# Patient Record
Sex: Female | Born: 1990 | Race: White | Hispanic: No | Marital: Single | Smoking: Former smoker
Health system: Southern US, Community
[De-identification: ages and names within clinical notes are randomized; demographics above are authoritative.]

## PROBLEM LIST (undated history)

## (undated) DIAGNOSIS — G43909 Migraine, unspecified, not intractable, without status migrainosus: Secondary | ICD-10-CM

## (undated) DIAGNOSIS — J45909 Unspecified asthma, uncomplicated: Secondary | ICD-10-CM

## (undated) DIAGNOSIS — O139 Gestational [pregnancy-induced] hypertension without significant proteinuria, unspecified trimester: Secondary | ICD-10-CM

## (undated) DIAGNOSIS — F32A Depression, unspecified: Secondary | ICD-10-CM

## (undated) DIAGNOSIS — F329 Major depressive disorder, single episode, unspecified: Secondary | ICD-10-CM

## (undated) HISTORY — PX: TONSILLECTOMY: SUR1361

---
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
## 2008-01-05 ENCOUNTER — Inpatient Hospital Stay (HOSPITAL_COMMUNITY): Admission: AD | Admit: 2008-01-05 | Discharge: 2008-01-05 | Payer: Self-pay | Admitting: Obstetrics

## 2008-02-11 ENCOUNTER — Inpatient Hospital Stay (HOSPITAL_COMMUNITY): Admission: AD | Admit: 2008-02-11 | Discharge: 2008-02-11 | Payer: Self-pay | Admitting: Obstetrics

## 2008-02-12 ENCOUNTER — Inpatient Hospital Stay (HOSPITAL_COMMUNITY): Admission: AD | Admit: 2008-02-12 | Discharge: 2008-02-15 | Payer: Self-pay | Admitting: Obstetrics

## 2008-03-13 ENCOUNTER — Inpatient Hospital Stay (HOSPITAL_COMMUNITY): Admission: AD | Admit: 2008-03-13 | Discharge: 2008-03-13 | Payer: Self-pay | Admitting: Obstetrics

## 2008-03-15 ENCOUNTER — Inpatient Hospital Stay (HOSPITAL_COMMUNITY): Admission: AD | Admit: 2008-03-15 | Discharge: 2008-03-18 | Payer: Self-pay | Admitting: Obstetrics

## 2008-06-30 ENCOUNTER — Emergency Department (HOSPITAL_COMMUNITY): Admission: EM | Admit: 2008-06-30 | Discharge: 2008-06-30 | Payer: Self-pay | Admitting: Emergency Medicine

## 2008-11-13 ENCOUNTER — Ambulatory Visit (HOSPITAL_COMMUNITY): Admission: RE | Admit: 2008-11-13 | Discharge: 2008-11-13 | Payer: Self-pay | Admitting: Obstetrics

## 2008-12-31 ENCOUNTER — Emergency Department (HOSPITAL_COMMUNITY): Admission: EM | Admit: 2008-12-31 | Discharge: 2008-12-31 | Payer: Self-pay | Admitting: Emergency Medicine

## 2009-06-11 ENCOUNTER — Emergency Department (HOSPITAL_COMMUNITY): Admission: EM | Admit: 2009-06-11 | Discharge: 2009-06-11 | Payer: Self-pay | Admitting: Emergency Medicine

## 2009-11-07 ENCOUNTER — Emergency Department (HOSPITAL_COMMUNITY): Admission: EM | Admit: 2009-11-07 | Discharge: 2009-11-07 | Payer: Self-pay | Admitting: Emergency Medicine

## 2010-08-29 ENCOUNTER — Other Ambulatory Visit: Payer: Self-pay | Admitting: Obstetrics

## 2010-08-29 DIAGNOSIS — N6320 Unspecified lump in the left breast, unspecified quadrant: Secondary | ICD-10-CM

## 2010-09-02 ENCOUNTER — Ambulatory Visit
Admission: RE | Admit: 2010-09-02 | Discharge: 2010-09-02 | Disposition: A | Discharge status: Home / Self Care | Payer: Medicaid Other | Source: Ambulatory Visit | Attending: Obstetrics | Admitting: Obstetrics

## 2010-09-02 DIAGNOSIS — N6320 Unspecified lump in the left breast, unspecified quadrant: Secondary | ICD-10-CM

## 2010-10-01 NOTE — Discharge Summary (Signed)
Mackenzie Maxwell, Mackenzie Maxwell                 ACCOUNT NO.:  [id number]   MEDICAL RECORD NO.:  [id number]         PATIENT TYPE:  WINP   LOCATION:                                FACILITY:  [facility]   PHYSICIAN:  Kathreen Cosier, M.D.DATE OF BIRTH:  1991-03-26   DATE OF ADMISSION:  02/12/2008  DATE OF DISCHARGE:                               DISCHARGE SUMMARY   The patient is an 20 year old gravida 4, para 0-0-3-0.  [facility] March 21, 2008.  The patient was admitted because of severe right side pain and  back pain from her shoulder to her groin.  The patient states she was  lying down at night 2 days prior to admission when the pain began.  On  admission, she had a CT of her back, which showed a small adrenal  hemorrhage.  She had muscle tenderness on palpation from her rib cage to  her iliac crest.  She had a maternal fetal consult and that was  negative.   Hemoglobin was 10.1, platelets 255, white count 8.  Electrolytes normal.  Coagulation studies normal.  Sodium 135, potassium 3.6, chloride 107.  Her workup was totally negative and this pain was attributed to  musculoskeletal pain.   She was discharged home on Tylox and Ambien on February 15, 2008.  The  patient felt much better at that time.   DISCHARGE DIAGNOSIS:  Musculoskeletal strain.           ______________________________  Kathreen Cosier, M.D.     BAM/MEDQ  D:  03/15/2008  T:  03/15/2008  Job:  [id number]

## 2010-10-01 NOTE — Discharge Summary (Signed)
NAMEARICELA, BERTAGNOLLI                 ACCOUNT NO.:  [id number]   MEDICAL RECORD NO.:  [id number]          PATIENT TYPE:  MAT   LOCATION:  MATC                          FACILITY:  [facility]   PHYSICIAN:  Kathreen Cosier, M.D.DATE OF BIRTH:  13-Sep-1990   DATE OF ADMISSION:  03/13/2008  DATE OF DISCHARGE:  03/13/2008                               DISCHARGE SUMMARY   An 20 year old gravida 4, para 0-0-3-0.  [facility] March 21, 2008.   DICTATION ENDS AT THIS POINT           ______________________________  Kathreen Cosier, M.D.     BAM/MEDQ  D:  03/15/2008  T:  03/15/2008  Job:  [id number]

## 2010-10-01 NOTE — H&P (Signed)
NAMEKRISTENA, Mackenzie Maxwell                 ACCOUNT NO.:  [id number]   MEDICAL RECORD NO.:  [id number]          PATIENT TYPE:  INP   LOCATION:  9158                          FACILITY:  [facility]   PHYSICIAN:  Roseanna Rainbow, M.D.DATE OF BIRTH:  Sep 26, 1990   DATE OF ADMISSION:  02/12/2008  DATE OF DISCHARGE:                              HISTORY & PHYSICAL   CHIEF COMPLAINT:  The patient is an 20 year old para 0 with an estimated  date of confinement of November 3, with an intrauterine pregnancy at 34  weeks complaining of right-sided back pain, epigastric pain, and right  lower quadrant pain.   HISTORY OF PRESENT ILLNESS:  The patient had presented to maternity  admissions the day before with diffused colicky epigastric pain.  She  returns this evening with lateralizing the pain to the right and  associated nausea and vomiting.  She had a normal bowel movement earlier  today.  She also describes a decreased appetite.  She denies any fever.  She also denies any recent trauma, urinary tract infection symptoms,  hematuria.   SOCIAL HISTORY:  She is single.  She denies any tobacco, ethanol, or  drug use.   PAST GYNECOLOGIC HISTORY:  Normal triad.  There is a history of  chlamydia.   PAST OBSTETRICAL HISTORY:  There is a history of 3 voluntary termination  of pregnancies.   PAST MEDICAL HISTORY:  Asthma.   PAST SURGICAL HISTORY:  Please see the above T and A.  Antepartum course  onset of care at 10 weeks.   OBSTETRICAL RISK FACTORS:  Adolescent.   FAMILY HISTORY:  Noncontributory.   PHYSICAL EXAMINATION:  VITAL SIGNS:  Stable, afebrile.  Fetal heart  tracing reassuring.  Tocodynamometer regular uterine contractions.  GENERAL:  Well-developed female in acute distress.  ABDOMEN:  No rebound or guarding.  Difficult secondary to the patient's  discomfort.  GENITOURINARY:  Sterile vaginal exam per the RN.  The cervix is closed.   LABORATORY AND WORKUP:  Abdominal ultrasound today normal.   White blood  cell count 12.7.  Today, it was 9.3 one day prior, hemoglobin 11.7,  hematocrit 34.3, platelets 289,000, lipase, amylase normal, complete  metabolic profile normal.  On September 25, urine micro negative.  Urinalysis, small leukocytes, DIC panel, fibrinogen 460, D-dimer is  1.22.   ASSESSMENT:  Intrauterine pregnancy at 34 plus weeks with acute  abdominal pain of questionable etiology.  Differential diagnosis, rule  out appendicitis, cold abruption.  Fetal heart tracing consistent with  fetal well being.   PLAN:  Admission.  We will repeat the DIC panel and obtain a CT scan of  the abdomen.  Supportive care.      Roseanna Rainbow, M.D.  Electronically Signed     LAJ/MEDQ  D:  02/12/2008  T:  02/13/2008  Job:  [id number]

## 2010-10-04 NOTE — Op Note (Signed)
[place]  Patient:    JAKAI, ONOFRE                          MRN: [id number] Proc. Date: 01/27/00 Adm. Date:  [id number] Attending:  Carlena Sax CC:         Teena Irani. Donnie Coffin, M.D.   Operative Report  PREOPERATIVE DIAGNOSIS: 1. Obstructive sleep apnea. 2. Tonsil and adenoid hypertrophy. 3. Nasal airway obstruction.  POSTOPERATIVE DIAGNOSES: 1. Obstructive sleep apnea. 2. Tonsil and adenoid hypertrophy. 3. Nasal airway obstruction.  OPERATION:  Tonsillectomy and adenoidectomy.  SURGEON:  Veverly Fells. Arletha Grippe, M.D.  ANESTHESIA:  General endotracheal.  INDICATION FOR PROCEDURE:  This is a 20 year old female referred per the courtesy of Dr. Maryellen Pile.  Does have a history of nasal airway obstruction, chronic mouth breathing, loud snoring at night and breath holding episodes consistent with obstructive sleep apnea. Physical examination does show extremely enlarged tonsils bilaterally and large adenoidal tissue in the nasopharynx.  Based on her history and physical examination, I have recommended proceeding with the above noted surgical procedure.  I discussed extensively with her and the family the risks and benefits of surgery, including the risk of general anesthesia, bleeding and normal recovery period after this type of surgery.  I have entertained questions and answered them appropriately.  Informed consent has been obtained.  The patient presents for the above noted procedure.  OPERATIVE FINDINGS:  Bilaterally enlarged tonsils.  Large adenoidal tissue in the nasopharynx.  DESCRIPTION OF PROCEDURE:  The patient was brought to the operating room and placed in the supine position.  General endotracheal anesthesia was administered via the anesthesiologist without complication.  The patient was administered 500 mg of Ancef IV times one and 6 mg of Decadron IV times one. The head of the table was turned 90 degrees.  The patients face was  draped in the standard fashion. A Crowe-Davis mouth retractor was inserted into the oral cavity and this was used to retract the mouth open.  First attention was turned to the right tonsil. Crowe-Dallas clamp was used to grasp the tonsil and retract it medially.  Harmonic scalpel was then used to dissect it free from the tonsillar fossa.  Once the tonsil was then transected, it was removed through the oral cavity and sent to the surgery pathology for permanent section analysis.  Bleeding was controlled with combination of harmonic scalpel and suction cautery without difficulty.  Next, attention was turned to the left tonsil and an identical procedure was carried out on this side. Compared to the right with identical results. After this done, a red rubber catheter was placed through the nares and brought out through the oral cavity.  This was used to retract the soft palate.  Indirect mirror examination of the nasopharynx showed enlarged adenoidal tissue obstructing the choana bilaterally.  An adenoidectomy was then performed with a few sweeps of the adenoid curet and bleeding was controlled with suction cautery without difficulty.  Nose and mouth were then irrigated with a copious amount of irrigation fluid and suctioned dry.  There was no evidence of any active bleeding.  The red rubber catheter was released and brought out through the nasal chamber under suction without difficulty.  Oral gastric tube was placed. This was used to decompress the stomach contents and then removed without incident.  Next a total of 3 cc of 0.5% Marcaine solution with 1:200,000 epinephrine was infiltrated into the anterior tonsillar pillar  and tongue base bilaterally. Crowe-Davis mouth retractors were released and brought out through the oral cavity without incident.  FLUIDS GIVEN DURING PROCEDURE:  Approximately 300 cc of Crystalloid.  ESTIMATED BLOOD LOSS:  Less than 50 cc.  DRAINS:  None.  PACKS:   None.  SPECIMENS SENT:  Tonsils times two and adenoids.  DISPOSITION:  The patient tolerated the procedure well without complications and was extubated in the operating room and transferred to recovery room in stable condition.  COUNTS:  Sponge, needle and instrument counts were correct at the end of the procedure.  TOTAL DURATION OF PROCEDURE:  Approximately one hour.  PLAN:  The patient will be admitted for overnight recovery and once she is recovered well, she will be discharged on January 28, 2000.  She will be sent home on Amoxicillin elixir 250 mg p.o. t.i.d. x10 days and Tylenol with codeine elixir 250 cc with two refills, 5 to 10 cc p.o. q.4h. p.r.n. pain. She is to have light activity, post tonsillectomy diet for two weeks after surgery.  Both her and her family were given oral and written instructions. They are to call with any problems with bleeding, fever, vomiting or reaction to medications, further questions.  She will follow up in the office for postoperative check on Tuesday, February 11, 2000 at 4:00 p.m. DD:  01/27/00 TD:  01/27/00 Job: [id number]

## 2011-02-10 LAB — POCT PREGNANCY, URINE: Operator id: 120561

## 2011-02-10 LAB — URINALYSIS, ROUTINE W REFLEX MICROSCOPIC
Bilirubin Urine: NEGATIVE
Glucose, UA: NEGATIVE
Glucose, UA: NEGATIVE
Hgb urine dipstick: NEGATIVE
Ketones, ur: NEGATIVE
Nitrite: NEGATIVE
Protein, ur: NEGATIVE
Specific Gravity, Urine: >1.03 — ABNORMAL HIGH
Specific Gravity, Urine: 1.02
pH: 6
pH: 7

## 2011-02-10 LAB — CBC
HCT: 33.4 — ABNORMAL LOW
Hemoglobin: 11.6 — ABNORMAL LOW
MCV: 80.3
Platelets: 313
RDW: 16.4 — ABNORMAL HIGH

## 2011-02-10 LAB — GC/CHLAMYDIA PROBE AMP, GENITAL

## 2011-02-10 LAB — WET PREP, GENITAL: Yeast Wet Prep HPF POC: NONE SEEN

## 2011-02-11 LAB — URINALYSIS, ROUTINE W REFLEX MICROSCOPIC
Bilirubin Urine: NEGATIVE
Glucose, UA: NEGATIVE
Hgb urine dipstick: NEGATIVE
Ketones, ur: >80 — AB
Ketones, ur: NEGATIVE
Nitrite: NEGATIVE
Nitrite: NEGATIVE
Protein, ur: 30 — AB
Protein, ur: NEGATIVE
Specific Gravity, Urine: 1.015
Urobilinogen, UA: 0.2
pH: 7

## 2011-02-11 LAB — URINE MICROSCOPIC-ADD ON

## 2011-02-13 LAB — WET PREP, GENITAL
Clue Cells Wet Prep HPF POC: NONE SEEN
Trich, Wet Prep: NONE SEEN

## 2011-02-13 LAB — URINALYSIS, ROUTINE W REFLEX MICROSCOPIC
Nitrite: NEGATIVE
Specific Gravity, Urine: 1.015
pH: 7

## 2011-02-17 LAB — CBC
HCT: 32.3 — ABNORMAL LOW
Hemoglobin: 10.5 — ABNORMAL LOW
Hemoglobin: 10.8 — ABNORMAL LOW
Hemoglobin: 10.8 — ABNORMAL LOW
MCHC: 34.1
MCHC: 34.1
MCV: 80.9
MCV: 81
MCV: 80.4
Platelets: 255
Platelets: 289
Platelets: 243
RBC: 3.8 — ABNORMAL LOW
RBC: 3.94
RBC: 4.31
RBC: 3.66 — ABNORMAL LOW
RDW: 15.8 — ABNORMAL HIGH
RDW: 15.8 — ABNORMAL HIGH
RDW: 17 — ABNORMAL HIGH
WBC: 10.1
WBC: 12.7 — ABNORMAL HIGH
WBC: 10.3

## 2011-02-17 LAB — DIC (DISSEMINATED INTRAVASCULAR COAGULATION)PANEL
D-Dimer, Quant: 1.26 — ABNORMAL HIGH
Fibrinogen: 464
Platelets: 257
Prothrombin Time: 13.2
Smear Review: NONE SEEN
aPTT: 27

## 2011-02-17 LAB — DIFFERENTIAL
Basophils Absolute: 0
Basophils Relative: 0
Basophils Relative: 1
Eosinophils Relative: 0
Eosinophils Relative: 1
Lymphocytes Relative: 11 — ABNORMAL LOW
Lymphs Abs: 1.4
Monocytes Absolute: 1.1 — ABNORMAL HIGH
Monocytes Relative: 13 — ABNORMAL HIGH
Neutro Abs: 5.4

## 2011-02-17 LAB — COMPREHENSIVE METABOLIC PANEL
ALT: 8
ALT: 9
AST: 13
AST: 15
Albumin: 2.4 — ABNORMAL LOW
Albumin: 3.2 — ABNORMAL LOW
Alkaline Phosphatase: 66
BUN: 4 — ABNORMAL LOW
CO2: 22
CO2: 22
Calcium: 8.1 — ABNORMAL LOW
Calcium: 8.5
Chloride: 107
Chloride: 108
Creatinine, Ser: 0.46
Creatinine, Ser: 0.47
Creatinine, Ser: 0.48
GFR calc Af Amer: >60
GFR calc Af Amer: >60
GFR calc non Af Amer: >60
GFR calc non Af Amer: >60
GFR calc non Af Amer: >60
Glucose, Bld: 92
Glucose, Bld: 98
Potassium: 4
Sodium: 133 — ABNORMAL LOW
Sodium: 135
Total Bilirubin: 0.5
Total Bilirubin: 0.8
Total Protein: 5.2 — ABNORMAL LOW

## 2011-02-17 LAB — URINALYSIS, ROUTINE W REFLEX MICROSCOPIC
Glucose, UA: NEGATIVE
Hgb urine dipstick: NEGATIVE
Hgb urine dipstick: NEGATIVE
Ketones, ur: NEGATIVE
Protein, ur: NEGATIVE
Specific Gravity, Urine: 1.01
Urobilinogen, UA: 0.2
Urobilinogen, UA: 0.2

## 2011-02-17 LAB — CCBB MATERNAL DONOR DRAW

## 2011-02-17 LAB — LIPASE, BLOOD
Lipase: 14
Lipase: 14

## 2011-02-17 LAB — ABO/RH: ABO/RH(D): O POS

## 2011-02-17 LAB — URINE MICROSCOPIC-ADD ON

## 2011-02-17 LAB — AMYLASE: Amylase: 98

## 2011-02-17 LAB — CORTISOL-AM, BLOOD: Cortisol - AM: 28 — ABNORMAL HIGH

## 2011-02-17 LAB — RPR: RPR Ser Ql: NONREACTIVE

## 2011-03-06 LAB — PREGNANCY, URINE: Preg Test, Ur: NEGATIVE

## 2011-04-26 ENCOUNTER — Emergency Department (HOSPITAL_COMMUNITY)
Admission: EM | Admit: 2011-04-26 | Discharge: 2011-04-26 | Disposition: A | Discharge status: Home / Self Care | Payer: Medicaid Other | Source: Home / Self Care | Attending: Emergency Medicine | Admitting: Emergency Medicine

## 2011-04-26 DIAGNOSIS — L259 Unspecified contact dermatitis, unspecified cause: Secondary | ICD-10-CM

## 2011-04-26 MED ORDER — PREDNISONE 10 MG PO TABS: 20.0000 mg | ORAL_TABLET | Freq: Every day | ORAL | Status: AC

## 2011-04-26 NOTE — ED Provider Notes (Signed)
History     CSN: [id number] Arrival date & time: 04/26/2011  2:28 PM   None     Chief Complaint  Patient presents with  . Rash    (Consider location/radiation/quality/duration/timing/severity/associated sxs/prior treatment) HPI Comments: Pt works in Customer service [employer].  Noticed 12/5 after work B medial ankles were itchy, red spots; now also has a patch on L forearm; thinks may have come in contact with something at work.   Patient is a 20 y.o. female presenting with rash. The history is provided by the patient.  Rash  The current episode started more than 2 days ago. The problem has been gradually worsening. The problem is associated with chemical exposure. There has been no fever. The rash is present on the right ankle, left ankle and left arm. The pain is at a severity of 0/10. The patient is experiencing no pain. Associated symptoms include itching. She has tried antihistamines for the symptoms. The treatment provided no relief.    History reviewed. No pertinent past medical history.  History reviewed. No pertinent past surgical history.  History reviewed. No pertinent family history.  History  Substance Use Topics  . Smoking status: Not on file  . Smokeless tobacco: Not on file  . Alcohol Use: Not on file    OB History    Grav Para Term Preterm Abortions TAB SAB Ect Mult Living                  Review of Systems  Constitutional: Negative for fever and chills.  Respiratory: Negative for shortness of breath and wheezing.   Skin: Positive for itching and rash.    Allergies  Ibuprofen  Home Medications   Current Outpatient Rx  Name Route Sig Dispense Refill  . DIPHENHYDRAMINE HCL 25 MG PO CAPS Oral Take 25 mg by mouth every 6 (six) hours as needed.      Marland Kitchen PREDNISONE 10 MG PO TABS Oral Take 2 tablets (20 mg total) by mouth daily. 10 tablet 0    BP 129/75  Pulse 56  Temp(Src) 98.2 F (36.8 C) (Oral)  Resp 16  SpO2 100%  LMP 04/17/2011  Physical Exam    Constitutional: She appears well-developed and well-nourished. No distress.  Pulmonary/Chest: Effort normal.  Skin: Skin is warm and dry. Rash noted. Rash is papular.       ED Course  Procedures (including critical care time)  Labs Reviewed - No data to display No results found.   1. Contact dermatitis       MDM          Cathlyn Parsons, NP 04/26/11 1615

## 2011-04-26 NOTE — ED Notes (Signed)
Patient complains of rash to both ankles, left forearm and right hand.  Patient states rash started on 04/23/11.  Patient states rash itches, hurts and burns.  Patient denies anyone at home having the same rash.  Patient has tried OTC benadryl without relief

## 2011-04-30 NOTE — ED Provider Notes (Signed)
Medical screening examination/treatment/procedure(s) were performed by non-physician practitioner and as supervising physician I was immediately available for consultation/collaboration.   Barkley Bruns MD.    Barkley Bruns, MD 04/30/11 [phone]

## 2011-10-02 DIAGNOSIS — J45909 Unspecified asthma, uncomplicated: Secondary | ICD-10-CM | POA: Insufficient documentation

## 2012-06-22 ENCOUNTER — Ambulatory Visit: Payer: 59

## 2013-09-12 ENCOUNTER — Other Ambulatory Visit: Payer: Self-pay | Admitting: Obstetrics

## 2014-11-18 ENCOUNTER — Encounter (HOSPITAL_COMMUNITY): Payer: Self-pay | Admitting: Emergency Medicine

## 2014-11-18 ENCOUNTER — Emergency Department (HOSPITAL_COMMUNITY): Payer: 59

## 2014-11-18 ENCOUNTER — Emergency Department (HOSPITAL_COMMUNITY)
Admission: EM | Admit: 2014-11-18 | Discharge: 2014-11-18 | Disposition: A | Discharge status: Home / Self Care | Payer: 59 | Attending: Emergency Medicine | Admitting: Emergency Medicine

## 2014-11-18 DIAGNOSIS — H538 Other visual disturbances: Secondary | ICD-10-CM | POA: Insufficient documentation

## 2014-11-18 DIAGNOSIS — R51 Headache: Secondary | ICD-10-CM | POA: Insufficient documentation

## 2014-11-18 DIAGNOSIS — Z720 Tobacco use: Secondary | ICD-10-CM | POA: Diagnosis not present

## 2014-11-18 DIAGNOSIS — R519 Headache, unspecified: Secondary | ICD-10-CM

## 2014-11-18 DIAGNOSIS — R112 Nausea with vomiting, unspecified: Secondary | ICD-10-CM | POA: Diagnosis not present

## 2014-11-18 DIAGNOSIS — J45909 Unspecified asthma, uncomplicated: Secondary | ICD-10-CM | POA: Diagnosis not present

## 2014-11-18 HISTORY — DX: Unspecified asthma, uncomplicated: J45.909

## 2014-11-18 MED ORDER — DIPHENHYDRAMINE HCL 50 MG/ML IJ SOLN
25.0000 mg | Freq: Once | INTRAMUSCULAR | Status: AC
  Administered 2014-11-18: 25 mg via INTRAVENOUS
  Filled 2014-11-18: qty 1

## 2014-11-18 MED ORDER — PROCHLORPERAZINE EDISYLATE 5 MG/ML IJ SOLN
10.0000 mg | Freq: Once | INTRAMUSCULAR | Status: AC
  Administered 2014-11-18: 10 mg via INTRAVENOUS
  Filled 2014-11-18: qty 2

## 2014-11-18 MED ORDER — ONDANSETRON 8 MG PO TBDP: ORAL_TABLET | ORAL | Status: DC

## 2014-11-18 MED ORDER — SODIUM CHLORIDE 0.9 % IV BOLUS (SEPSIS)
1000.0000 mL | Freq: Once | INTRAVENOUS | Status: AC
  Administered 2014-11-18: 1000 mL via INTRAVENOUS

## 2014-11-18 NOTE — ED Notes (Signed)
Pt states she has headaches, but this is a migraine and is the worst she has ever experienced.  Her eyes are sensitive to the light.  Nausea is present at this time.  Has had emesis x2 today.  Negative for fever or diarrhea.  She cannot relate this to any event that would have prompted the migraine.

## 2014-11-18 NOTE — ED Provider Notes (Signed)
CSN: [id number]     Arrival date & time 11/18/14  1429 History   First MD Initiated Contact with Patient 11/18/14 1503     Chief Complaint  Patient presents with  . Migraine  . Emesis     (Consider location/radiation/quality/duration/timing/severity/associated sxs/prior Treatment) The history is provided by the patient and medical records. No language interpreter was used.    Mackenzie Maxwell is a 24 y.o. female  with a hx of asthma presents to the Emergency Department complaining of sudden, persistent, progressively worsening left sided headache onset 2 hours prior to arrival. Pt denies thunderclap headache, but endorses worst headache of her life.  She rates the pain at a 10/10, describes it as sharp and denies radiation of the pain.  She reports intermittent headaches in the past but these are generalized and resolve with tylenol.  Associated symptoms include blurred vision, nausea and vomiting without abdominal pain.  Nothing makes it better and light and sound makes it worse.  Pt denies fever, chills, neck pain, chest pain, SOB, abd pain, diarrhea, focal weakness, numbness, tingling, dysuria, hematuria, syncope.  She denies IVDU, cocaine, cancer or blood thinners.  LMP: 11-11-14.     Past Medical History  Diagnosis Date  . Asthma    Past Surgical History  Procedure Laterality Date  . Tonsillectomy     No family history on file. History  Substance Use Topics  . Smoking status: Current Every Day Smoker -- 1.00 packs/day    Types: Cigarettes  . Smokeless tobacco: Not on file  . Alcohol Use: Yes     Comment: social    OB History    No data available     Review of Systems  Constitutional: Negative for fever, diaphoresis, appetite change, fatigue and unexpected weight change.  HENT: Negative for mouth sores.   Eyes: Positive for photophobia and visual disturbance.  Respiratory: Negative for cough, chest tightness, shortness of breath and wheezing.   Cardiovascular: Negative for  chest pain.  Gastrointestinal: Positive for nausea and vomiting. Negative for abdominal pain, diarrhea and constipation.  Endocrine: Negative for polydipsia, polyphagia and polyuria.  Genitourinary: Negative for dysuria, urgency, frequency and hematuria.  Musculoskeletal: Negative for back pain and neck stiffness.  Skin: Negative for rash.  Allergic/Immunologic: Negative for immunocompromised state.  Neurological: Positive for headaches. Negative for syncope and light-headedness.  Hematological: Does not bruise/bleed easily.  Psychiatric/Behavioral: Negative for sleep disturbance. The patient is not nervous/anxious.       Allergies  Ibuprofen  Home Medications   Prior to Admission medications   Medication Sig Start Date End Date Taking? Authorizing Provider  ondansetron (ZOFRAN ODT) 8 MG disintegrating tablet 8mg  ODT q4 hours prn nausea 11/18/14   Julianna Vanwagner, PA-C   BP 132/81 mmHg  Pulse 60  Temp(Src) 98 F (36.7 C) (Oral)  Resp 20  SpO2 100%  LMP 11/11/2014 (Exact Date) Physical Exam  Constitutional: She is oriented to person, place, and time. She appears well-developed and well-nourished. No distress.  Awake, alert, nontoxic appearance  HENT:  Head: Normocephalic and atraumatic.  Mouth/Throat: Oropharynx is clear and moist. No oropharyngeal exudate.  Eyes: Conjunctivae and EOM are normal. Pupils are equal, round, and reactive to light. No scleral icterus.  No horizontal, vertical or rotational nystagmus  Neck: Normal range of motion. Neck supple.  Full active and passive ROM without pain No midline or paraspinal tenderness No nuchal rigidity or meningeal signs  Cardiovascular: Normal rate, regular rhythm and intact distal pulses.   Pulmonary/Chest: Effort  normal and breath sounds normal. No respiratory distress. She has no wheezes. She has no rales.  Equal chest expansion  Abdominal: Soft. Bowel sounds are normal. She exhibits no mass. There is no tenderness.  There is no rebound and no guarding.  Musculoskeletal: Normal range of motion. She exhibits no edema.  Lymphadenopathy:    She has no cervical adenopathy.  Neurological: She is alert and oriented to person, place, and time. She has normal reflexes. No cranial nerve deficit. She exhibits normal muscle tone. Coordination normal.  Mental Status:  Alert, oriented, thought content appropriate. Speech fluent without evidence of aphasia. Able to follow 2 step commands without difficulty.  Cranial Nerves:  II:  Peripheral visual fields grossly normal, pupils equal, round, reactive to light III,IV, VI: ptosis not present, extra-ocular motions intact bilaterally  V,VII: smile symmetric, facial light touch sensation equal VIII: hearing grossly normal bilaterally  IX,X: gag reflex present  XI: bilateral shoulder shrug equal and strong XII: midline tongue extension  Motor:  5/5 in upper and lower extremities bilaterally including strong and equal grip strength and dorsiflexion/plantar flexion Sensory: Pinprick and light touch normal in all extremities.  Deep Tendon Reflexes: 2+ and symmetric  Cerebellar: normal finger-to-nose with bilateral upper extremities Gait: normal gait and balance CV: distal pulses palpable throughout   Skin: Skin is warm and dry. No rash noted. She is not diaphoretic.  Psychiatric: She has a normal mood and affect. Her behavior is normal. Judgment and thought content normal.  Nursing note and vitals reviewed.   ED Course  Procedures (including critical care time) Labs Review Labs Reviewed - No data to display  Imaging Review Ct Head Wo Contrast  11/18/2014   CLINICAL DATA:  Headaches, migraines, worst ever experienced, photophobia, nausea, vomiting, history asthma and smoking  EXAM: CT HEAD WITHOUT CONTRAST  TECHNIQUE: Contiguous axial images were obtained from the base of the skull through the vertex without intravenous contrast.  COMPARISON:  11/07/2009  FINDINGS:  Normal ventricular morphology.  No midline shift or mass effect.  Normal appearance of brain parenchyma.  No intracranial hemorrhage, mass lesion, or acute infarction.  Visualized paranasal sinuses and mastoid air cells clear.  Bones unremarkable.  IMPRESSION: Normal exam.   Electronically Signed   By: Lavonia Dana M.D.   On: 11/18/2014 16:17     EKG Interpretation None      MDM   Final diagnoses:  Nonintractable headache, unspecified chronicity pattern, unspecified headache type   Mackenzie Maxwell presents with sudden onset headache, different from any other in the past, onset 2 hours ago.  Pt without focal neurologic deficit on exam.  Will give abortive therapy and obtain CT scan.  4:38 PM CT without acute abnormality.  Specifically no evidence of SAH.  Pt headache has resolved and she remains without focal neurologic deficits.  Presentation is non concerning for Meningitis, or temporal arteritis. Pt is afebrile with no focal neuro deficits, nuchal rigidity.  Pt with complete resolution of visual changes after headache resolution. Pt is to follow up with PCP to discuss further medication. Pt verbalizes understanding and is agreeable with plan to dc.   BP 132/81 mmHg  Pulse 60  Temp(Src) 98 F (36.7 C) (Oral)  Resp 20  SpO2 100%  LMP 11/11/2014 (Exact Date)      Abigail Butts, PA-C 11/18/14 1640  Charlesetta Shanks, MD 11/19/14 2328

## 2014-11-18 NOTE — Discharge Instructions (Signed)
1. Medications: zofran for nausea, usual home medications 2. Treatment: rest, drink plenty of fluids,  3. Follow Up: Please followup with your primary doctor in 3 days for discussion of your diagnoses and further evaluation after today's visit; if you do not have a primary care doctor use the resource guide provided to find one; Please return to the ER for worsening headache, persistent vomiting, fevers   General Headache Without Cause A headache is pain or discomfort felt around the head or neck area. The specific cause of a headache may not be found. There are many causes and types of headaches. A few common ones are:  Tension headaches.  Migraine headaches.  Cluster headaches.  Chronic daily headaches. HOME CARE INSTRUCTIONS   Keep all follow-up appointments with your caregiver or any specialist referral.  Only take over-the-counter or prescription medicines for pain or discomfort as directed by your caregiver.  Lie down in a dark, quiet room when you have a headache.  Keep a headache journal to find out what may trigger your migraine headaches. For example, write down:  What you eat and drink.  How much sleep you get.  Any change to your diet or medicines.  Try massage or other relaxation techniques.  Put ice packs or heat on the head and neck. Use these 3 to 4 times per day for 15 to 20 minutes each time, or as needed.  Limit stress.  Sit up straight, and do not tense your muscles.  Quit smoking if you smoke.  Limit alcohol use.  Decrease the amount of caffeine you drink, or stop drinking caffeine.  Eat and sleep on a regular schedule.  Get 7 to 9 hours of sleep, or as recommended by your caregiver.  Keep lights dim if bright lights bother you and make your headaches worse. SEEK MEDICAL CARE IF:   You have problems with the medicines you were prescribed.  Your medicines are not working.  You have a change from the usual headache.  You have nausea or  vomiting. SEEK IMMEDIATE MEDICAL CARE IF:   Your headache becomes severe.  You have a fever.  You have a stiff neck.  You have loss of vision.  You have muscular weakness or loss of muscle control.  You start losing your balance or have trouble walking.  You feel faint or pass out.  You have severe symptoms that are different from your first symptoms. MAKE SURE YOU:   Understand these instructions.  Will watch your condition.  Will get help right away if you are not doing well or get worse. Document Released: 05/05/2005 Document Revised: 07/28/2011 Document Reviewed: 05/21/2011 [facility] Patient Information 2015 [place], [place]. This information is not intended to replace advice given to you by your health care provider. Make sure you discuss any questions you have with your health care provider.

## 2014-11-18 NOTE — ED Notes (Signed)
PA at bedside.

## 2014-11-18 NOTE — ED Notes (Signed)
Pt from home c/o migraine and emesis x2 hours. Pt has not taken any meds for pain. Pt is A&O and in NAD

## 2016-02-14 DIAGNOSIS — G43909 Migraine, unspecified, not intractable, without status migrainosus: Secondary | ICD-10-CM | POA: Insufficient documentation

## 2017-06-19 ENCOUNTER — Encounter (HOSPITAL_COMMUNITY): Payer: Self-pay

## 2017-06-19 ENCOUNTER — Other Ambulatory Visit (HOSPITAL_COMMUNITY): Payer: Self-pay | Admitting: Obstetrics & Gynecology

## 2017-06-19 ENCOUNTER — Ambulatory Visit (HOSPITAL_COMMUNITY)
Admission: RE | Admit: 2017-06-19 | Discharge: 2017-06-19 | Disposition: A | Discharge status: Home / Self Care | Payer: 59 | Source: Ambulatory Visit | Attending: Obstetrics & Gynecology | Admitting: Obstetrics & Gynecology

## 2017-06-19 DIAGNOSIS — R1031 Right lower quadrant pain: Secondary | ICD-10-CM

## 2017-06-19 DIAGNOSIS — D251 Intramural leiomyoma of uterus: Secondary | ICD-10-CM | POA: Insufficient documentation

## 2017-06-19 DIAGNOSIS — N939 Abnormal uterine and vaginal bleeding, unspecified: Secondary | ICD-10-CM

## 2017-06-19 DIAGNOSIS — R102 Pelvic and perineal pain: Secondary | ICD-10-CM | POA: Insufficient documentation

## 2017-06-19 DIAGNOSIS — O209 Hemorrhage in early pregnancy, unspecified: Secondary | ICD-10-CM | POA: Insufficient documentation

## 2017-06-19 DIAGNOSIS — O3411 Maternal care for benign tumor of corpus uteri, first trimester: Secondary | ICD-10-CM | POA: Diagnosis not present

## 2017-06-19 DIAGNOSIS — Z3A01 Less than 8 weeks gestation of pregnancy: Secondary | ICD-10-CM | POA: Diagnosis not present

## 2018-03-17 DIAGNOSIS — E279 Disorder of adrenal gland, unspecified: Secondary | ICD-10-CM | POA: Insufficient documentation

## 2018-03-22 DIAGNOSIS — D582 Other hemoglobinopathies: Secondary | ICD-10-CM | POA: Insufficient documentation

## 2018-04-02 DIAGNOSIS — Z283 Underimmunization status: Secondary | ICD-10-CM | POA: Insufficient documentation

## 2018-04-02 DIAGNOSIS — Z2839 Other underimmunization status: Secondary | ICD-10-CM | POA: Insufficient documentation

## 2018-04-16 DIAGNOSIS — F32A Depression, unspecified: Secondary | ICD-10-CM | POA: Insufficient documentation

## 2018-04-16 DIAGNOSIS — F329 Major depressive disorder, single episode, unspecified: Secondary | ICD-10-CM | POA: Insufficient documentation

## 2018-09-27 ENCOUNTER — Inpatient Hospital Stay (HOSPITAL_COMMUNITY)
Admission: AD | Admit: 2018-09-27 | Discharge: 2018-09-27 | Disposition: A | Discharge status: Home / Self Care | Payer: No Typology Code available for payment source | Attending: Obstetrics & Gynecology | Admitting: Obstetrics & Gynecology

## 2018-09-27 ENCOUNTER — Other Ambulatory Visit: Payer: Self-pay

## 2018-09-27 ENCOUNTER — Encounter (HOSPITAL_COMMUNITY): Payer: Self-pay

## 2018-09-27 DIAGNOSIS — Z87891 Personal history of nicotine dependence: Secondary | ICD-10-CM | POA: Diagnosis not present

## 2018-09-27 DIAGNOSIS — R030 Elevated blood-pressure reading, without diagnosis of hypertension: Secondary | ICD-10-CM | POA: Diagnosis not present

## 2018-09-27 DIAGNOSIS — Z3A36 36 weeks gestation of pregnancy: Secondary | ICD-10-CM | POA: Diagnosis not present

## 2018-09-27 DIAGNOSIS — O26893 Other specified pregnancy related conditions, third trimester: Secondary | ICD-10-CM | POA: Insufficient documentation

## 2018-09-27 DIAGNOSIS — O479 False labor, unspecified: Secondary | ICD-10-CM

## 2018-09-27 LAB — PROTEIN / CREATININE RATIO, URINE
Creatinine, Urine: 194.89 mg/dL
Protein Creatinine Ratio: 0.08 mg/mg{Cre} (ref 0.00–0.15)
Total Protein, Urine: 16 mg/dL

## 2018-09-27 LAB — COMPREHENSIVE METABOLIC PANEL
ALT: 14 U/L (ref 0–44)
AST: 15 U/L (ref 15–41)
Albumin: 2.8 g/dL — ABNORMAL LOW (ref 3.5–5.0)
Alkaline Phosphatase: 70 U/L (ref 38–126)
Anion gap: 10 (ref 5–15)
BUN: <5 mg/dL — ABNORMAL LOW (ref 6–20)
CO2: 20 mmol/L — ABNORMAL LOW (ref 22–32)
Calcium: 8.6 mg/dL — ABNORMAL LOW (ref 8.9–10.3)
Chloride: 104 mmol/L (ref 98–111)
Creatinine, Ser: 0.58 mg/dL (ref 0.44–1.00)
GFR calc Af Amer: >60 mL/min (ref >60)
GFR calc non Af Amer: >60 mL/min (ref >60)
Glucose, Bld: 87 mg/dL (ref 70–99)
Potassium: 3.8 mmol/L (ref 3.5–5.1)
Sodium: 134 mmol/L — ABNORMAL LOW (ref 135–145)
Total Bilirubin: 0.6 mg/dL (ref 0.3–1.2)
Total Protein: 6.1 g/dL — ABNORMAL LOW (ref 6.5–8.1)

## 2018-09-27 LAB — CBC
HCT: 31.3 % — ABNORMAL LOW (ref 36.0–46.0)
Hemoglobin: 10.5 g/dL — ABNORMAL LOW (ref 12.0–15.0)
MCH: 25 pg — ABNORMAL LOW (ref 26.0–34.0)
MCHC: 33.5 g/dL (ref 30.0–36.0)
MCV: 74.5 fL — ABNORMAL LOW (ref 80.0–100.0)
Platelets: 300 10*3/uL (ref 150–400)
RBC: 4.2 MIL/uL (ref 3.87–5.11)
RDW: 15.9 % — ABNORMAL HIGH (ref 11.5–15.5)
WBC: 10.4 10*3/uL (ref 4.0–10.5)
nRBC: 0 % (ref 0.0–0.2)

## 2018-09-27 MED ORDER — MORPHINE SULFATE (PF) 4 MG/ML IV SOLN
4.0000 mg | Freq: Once | INTRAVENOUS | Status: AC
  Administered 2018-09-27: 22:00:00 4 mg via INTRAMUSCULAR
  Filled 2018-09-27: qty 1

## 2018-09-27 NOTE — MAU Provider Note (Signed)
History     CSN: [id number]  Arrival date and time: 09/27/18 1805   First Provider Initiated Contact with Patient 09/27/18 2055      Chief Complaint  Patient presents with  . Contractions   Mackenzie Maxwell is a 28 y.o. G2P1001 at [redacted]w[redacted]d who presents for Contractions. She was evaluated by the nurse and bp were elevated.  Provider in room to assess and patient denies HA, visual disturbances, SOB, and RUQ pain.  Patient reports swelling of the legs, but states this occurred around 27 weeks and she was evaluated for PIH at that time.  Patient reports that her pain is 10/10 when contractions occur.  Patient endorses good fetal movement and denies LoF or other vaginal concerns including discharge, bleeding, or odor.      OB History    Gravida  2   Para  1   Term  1   Preterm      AB      Living  1     SAB      TAB      Ectopic      Multiple      Live Births  1           Past Medical History:  Diagnosis Date  . Asthma     Past Surgical History:  Procedure Laterality Date  . TONSILLECTOMY      No family history on file.  Social History   Tobacco Use  . Smoking status: Former Smoker    Packs/day: 1.00    Types: Cigarettes    Last attempt to quit: 09/26/2008    Years since quitting: 10.0  . Smokeless tobacco: Never Used  Substance Use Topics  . Alcohol use: Yes    Comment: social   . Drug use: No    Allergies:  Allergies  Allergen Reactions  . Ibuprofen     Child allergy. Has taken as an adult with no problems.    Medications Prior to Admission  Medication Sig Dispense Refill Last Dose  . ondansetron (ZOFRAN ODT) 8 MG disintegrating tablet 8mg  ODT q4 hours prn nausea 4 tablet 0     Review of Systems  Constitutional: Negative for chills and fever.  Eyes: Negative for photophobia and visual disturbance.  Respiratory: Negative for cough and shortness of breath.   Gastrointestinal: Positive for abdominal pain (Cramping s/t Contractions).   Musculoskeletal: Positive for back pain (Aching s/t Contractions).  Neurological: Negative for headaches.   Physical Exam   Blood pressure 129/83, pulse 94, temperature 99.3 F (37.4 C), temperature source Oral, resp. rate 20, weight 107 kg, SpO2 99 %. Vitals:   09/27/18 2146 09/27/18 2201 09/27/18 2216 09/27/18 2250  BP: (!) 144/84 (!) 144/100 126/70 137/74  Pulse: 89 96 93 99  Resp:    19  Temp:      TempSrc:      SpO2:      Weight:        Physical Exam  Constitutional: She is oriented to person, place, and time.  Obese  HENT:  Head: Normocephalic and atraumatic.  Eyes: Conjunctivae are normal.  Neck: Normal range of motion.  Cardiovascular: Normal rate, regular rhythm and normal heart sounds.  Respiratory: Effort normal and breath sounds normal.  GI: Soft. There is no abdominal tenderness.  Gravid--fundal height appears AGA, Soft RT, Contractions palpate mild   Genitourinary:    Genitourinary Comments: Deferred by Provider  Dilation: 1 Effacement (%): Thick Cervical Position: Posterior Station: -3  Presentation: Vertex Exam by:: Esau Grew, RN    Musculoskeletal: Normal range of motion.        General: Edema (Calves-Nonpitting) present.  Neurological: She is alert and oriented to person, place, and time.  Skin: Skin is warm and dry.  Psychiatric: She has a normal mood and affect. Her behavior is normal.    Fetal Assessment 120 bpm, Mod Var, -Decels, +Accels Toco: Palpates mild  MAU Course   Results for orders placed or performed during the hospital encounter of 09/27/18 (from the past 24 hour(s))  Protein / creatinine ratio, urine     Status: None   Collection Time: 09/27/18  8:46 PM  Result Value Ref Range   Creatinine, Urine 194.89 mg/dL   Total Protein, Urine 16 mg/dL   Protein Creatinine Ratio 0.08 0.00 - 0.15 mg/mg[Cre]  CBC     Status: Abnormal   Collection Time: 09/27/18  8:53 PM  Result Value Ref Range   WBC 10.4 4.0 - 10.5 K/uL   RBC 4.20  3.87 - 5.11 MIL/uL   Hemoglobin 10.5 (L) 12.0 - 15.0 g/dL   HCT 31.3 (L) 36.0 - 46.0 %   MCV 74.5 (L) 80.0 - 100.0 fL   MCH 25.0 (L) 26.0 - 34.0 pg   MCHC 33.5 30.0 - 36.0 g/dL   RDW 15.9 (H) 11.5 - 15.5 %   Platelets 300 150 - 400 K/uL   nRBC 0.0 0.0 - 0.2 %  Comprehensive metabolic panel     Status: Abnormal   Collection Time: 09/27/18  8:53 PM  Result Value Ref Range   Sodium 134 (L) 135 - 145 mmol/L   Potassium 3.8 3.5 - 5.1 mmol/L   Chloride 104 98 - 111 mmol/L   CO2 20 (L) 22 - 32 mmol/L   Glucose, Bld 87 70 - 99 mg/dL   BUN <5 (L) 6 - 20 mg/dL   Creatinine, Ser 0.58 0.44 - 1.00 mg/dL   Calcium 8.6 (L) 8.9 - 10.3 mg/dL   Total Protein 6.1 (L) 6.5 - 8.1 g/dL   Albumin 2.8 (L) 3.5 - 5.0 g/dL   AST 15 15 - 41 U/L   ALT 14 0 - 44 U/L   Alkaline Phosphatase 70 38 - 126 U/L   Total Bilirubin 0.6 0.3 - 1.2 mg/dL   GFR calc non Af Amer >60 >60 mL/min   GFR calc Af Amer >60 >60 mL/min   Anion gap 10 5 - 15   No results found.  MDM PE Labs: CBC, CMP, PC Ratio EFM  Assessment and Plan  28 year old G2P1001 at 36.3weeks Cat I FT Contractions Elevated BP  -Exam findings discussed -Offered and accepts pain medication. -Labs pending -Patient informed that POC would be discussed once results return. -Give Morphine 4mg  now. -Will monitor -NST Reactive, but continue to monitor.  Follow Up 2225 Elevated BP w/o Dx of GHTN  -Labs return without significant findings. -VE remains unchanged. -Patient informed of results and has no questions. -Reports some relief with morphine dosing.  -Encouraged to follow up with primary ob as scheduled and patient reports appt on Thursday Sep 30, 2018 -Questions regarding labor and contractions addressed including when to come for evaluation. -Patient also encouraged to contact primary ob, via phone, regarding guidance about sympotms when she is unsure. -Discussed PreEclampsia symptoms and precautions given. -Labor precautions  given. -Patient without further questions or concerns. -Discharged to home in stable condition  Maryann Conners MSN, CNM 09/27/2018, 9:09 PM

## 2018-09-27 NOTE — Discharge Instructions (Signed)
First Stage of Labor °Labor is your body's natural process of moving your baby and other structures, including the placenta and umbilical cord, out of your uterus. There are three stages of labor. How long each stage lasts is different for every woman. But certain events happen during each stage that are the same for everyone. °· The first stage starts when true labor begins. This stage ends when your cervix, which is the opening from your uterus into your vagina, is completely open (dilated). °· The second stage begins when your cervix is fully dilated and you start pushing. This stage ends when your baby is born. °· The third stage is the delivery of the organ that nourished your baby during pregnancy (placenta). °First stage of labor °As your due date gets closer, you may start to notice certain physical changes that mean labor is going to start soon. You may feel that your baby has dropped lower into your pelvis. You may experience irregular, often painless, contractions that go away when you walk around or lie down (Braxton Hicks contractions). This is also called false labor. °The first stage of labor begins when you start having contractions that come at regular (evenly spaced) intervals and your cervix starts to get thinner and wider in preparation for your baby to pass through. Birth care providers measure the dilation of your cervix in centimeters (cm). One centimeter is a little less than one-half of an inch. The first stage ends when your cervix is dilated to 10 cm. The first stage of labor is divided into three phases: °· Early phase. °· Active phase. °· Transitional phase. °The length of the first stage of labor varies. It may be longer if this is your first pregnancy. You may spend most of this stage at home trying to relax and stay comfortable. °How does this affect me? °During the first stage of labor, you will move through three phases. °What happens in the early phase? °· You will start to have  regular contractions that last 30-60 seconds. Contractions may come every 5-20 minutes. Keep track of your contractions and call your birth care provider. °· Your water may break during this phase. °· You may notice a clear or slightly bloody discharge of mucus (mucus plug) from your vagina. °· Your cervix will dilate to 3-6 cm. °What happens in the active phase? °The active phase usually lasts 3-5 hours. You may go to the hospital or birth center around this time. During the active phase: °· Your contractions will become stronger, longer, and more uncomfortable. °· Your contractions may last 45-90 seconds and come every 3-5 minutes. °· You may feel lower back pain. °· Your birth care providers may examine your cervix and feel your belly to find the position of your baby. °· You may have a monitor strapped to your belly to measure your contractions and your baby's heart rate. °· You may start using your pain management options. °· Your cervix may be dilated to 6 cm and may start to dilate more quickly. °What happens in the transitional phase? °The transitional phase typically lasts from 30 minutes to 2 hours. At the end of this phase, your cervix will be fully dilated to 10 cm. During the transitional phase: °· Contractions will get stronger and longer. °· Contractions may last 60-90 seconds and come less than 2 minutes apart. °· You may feel hot flashes, chills, or nausea. °How does this affect my baby? °During the first stage of labor, your baby will   gradually move down into your birth canal. Follow these instructions at home and in the hospital or birth center:   When labor first begins, try to stay calm. You are still in the early phase. If it is night, try to get some sleep. If it is day, try to relax and save your energy. You may want to make some calls and get ready to go to the hospital or birth center.  When you are in the early phase, try these methods to help ease discomfort: ? Deep breathing and  muscle relaxation. ? Taking a walk. ? Taking a warm bath or shower.  Drink some fluids and have a light snack if you feel like it.  Keep track of your contractions.  Based on the plan you created with your birth care provider, call when your contractions indicate it is time.  If your water breaks, note the time, color, and odor of the fluid.  When you are in the active phase, do your breathing exercises and rely on your support people and your team of birth care providers. Contact a health care provider if:  Your contractions are strong and regular.  You have lower back pain or cramping.  Your water breaks.  You lose your mucus plug. Get help right away if you:  Have a severe headache that does not go away.  Have changes in your vision.  Have severe pain in your upper belly.  Do not feel the baby move.  Have bright red bleeding. Summary  The first stage of labor starts when true labor begins, and it ends when your cervix is dilated to 10 cm.  The first stage of labor has three phases: early, active, and transitional.  Your baby moves into the birth canal during the first stage of labor.  You may have contractions that become stronger and longer. You may also lose your mucus plug and have your water break.  Call your birth care provider when your contractions are frequent and strong enough to go to the hospital or birth center. This information is not intended to replace advice given to you by your health care provider. Make sure you discuss any questions you have with your health care provider. Document Released: 07/19/2017 Document Revised: 01/23/2018 Document Reviewed: 07/19/2017 Elsevier Interactive Patient Education  2019 [employer].

## 2018-09-27 NOTE — MAU Note (Signed)
Pt said ctx started around 5pm, feels like 5 min apart. No LOF or bleeding. 0cm dilated in office last check.

## 2018-09-29 DIAGNOSIS — O139 Gestational [pregnancy-induced] hypertension without significant proteinuria, unspecified trimester: Secondary | ICD-10-CM | POA: Diagnosis present

## 2018-10-02 ENCOUNTER — Encounter (HOSPITAL_COMMUNITY): Payer: Self-pay

## 2018-10-02 ENCOUNTER — Other Ambulatory Visit: Payer: Self-pay

## 2018-10-02 ENCOUNTER — Inpatient Hospital Stay (HOSPITAL_COMMUNITY)
Admission: AD | Admit: 2018-10-02 | Discharge: 2018-10-05 | DRG: 787 | Disposition: A | Discharge status: Home / Self Care | Payer: No Typology Code available for payment source | Attending: Obstetrics and Gynecology | Admitting: Obstetrics and Gynecology

## 2018-10-02 DIAGNOSIS — O134 Gestational [pregnancy-induced] hypertension without significant proteinuria, complicating childbirth: Secondary | ICD-10-CM | POA: Diagnosis not present

## 2018-10-02 DIAGNOSIS — O99354 Diseases of the nervous system complicating childbirth: Secondary | ICD-10-CM | POA: Diagnosis present

## 2018-10-02 DIAGNOSIS — Z1159 Encounter for screening for other viral diseases: Secondary | ICD-10-CM

## 2018-10-02 DIAGNOSIS — J45909 Unspecified asthma, uncomplicated: Secondary | ICD-10-CM | POA: Diagnosis present

## 2018-10-02 DIAGNOSIS — O9952 Diseases of the respiratory system complicating childbirth: Secondary | ICD-10-CM | POA: Diagnosis present

## 2018-10-02 DIAGNOSIS — E669 Obesity, unspecified: Secondary | ICD-10-CM | POA: Diagnosis present

## 2018-10-02 DIAGNOSIS — G43909 Migraine, unspecified, not intractable, without status migrainosus: Secondary | ICD-10-CM | POA: Diagnosis present

## 2018-10-02 DIAGNOSIS — Z3A37 37 weeks gestation of pregnancy: Secondary | ICD-10-CM

## 2018-10-02 DIAGNOSIS — O99214 Obesity complicating childbirth: Secondary | ICD-10-CM | POA: Diagnosis present

## 2018-10-02 DIAGNOSIS — O139 Gestational [pregnancy-induced] hypertension without significant proteinuria, unspecified trimester: Secondary | ICD-10-CM | POA: Diagnosis present

## 2018-10-02 DIAGNOSIS — Z87891 Personal history of nicotine dependence: Secondary | ICD-10-CM

## 2018-10-02 HISTORY — DX: Gestational (pregnancy-induced) hypertension without significant proteinuria, unspecified trimester: O13.9

## 2018-10-02 HISTORY — DX: Migraine, unspecified, not intractable, without status migrainosus: G43.909

## 2018-10-02 HISTORY — DX: Depression, unspecified: F32.A

## 2018-10-02 MED ORDER — LABETALOL HCL 5 MG/ML IV SOLN: 80.0000 mg | INTRAVENOUS | Status: DC | PRN

## 2018-10-02 MED ORDER — HYDRALAZINE HCL 20 MG/ML IJ SOLN: 10.0000 mg | INTRAMUSCULAR | Status: DC | PRN

## 2018-10-02 MED ORDER — LABETALOL HCL 5 MG/ML IV SOLN: 20.0000 mg | INTRAVENOUS | Status: DC | PRN

## 2018-10-02 MED ORDER — BUTALBITAL-APAP-CAFFEINE 50-325-40 MG PO TABS
1.0000 | ORAL_TABLET | Freq: Once | ORAL | Status: AC | PRN
  Administered 2018-10-03: 1 via ORAL
  Filled 2018-10-02: qty 1

## 2018-10-02 MED ORDER — LABETALOL HCL 5 MG/ML IV SOLN: 40.0000 mg | INTRAVENOUS | Status: DC | PRN

## 2018-10-02 NOTE — MAU Note (Signed)
Pt reports she was diagnosed with GHTN last Monday. Reports she has had an ongoing HA since Tuesday. Was given a RX for oxycodone. Reports that it helps her HA, but when the medication wears off the HA is back. Reports she does not like to take the medication since she is pregnant. Last took this morning around 11am. Pt denies vision changes or RUQ pain, but reports sensitivity to light. Has a history of migraines but reports that this HA does not feel like her usual migraines. Reports her highest BP was 158/93 and the lowest was 128/90. Pt denies regular contractions, vaginal bleeding or LOF. Reports good fetal movement.

## 2018-10-03 ENCOUNTER — Encounter (HOSPITAL_COMMUNITY)
Admission: AD | Disposition: A | Discharge status: Home / Self Care | Payer: Self-pay | Source: Home / Self Care | Attending: Obstetrics and Gynecology

## 2018-10-03 ENCOUNTER — Inpatient Hospital Stay (HOSPITAL_COMMUNITY): Payer: No Typology Code available for payment source | Admitting: Anesthesiology

## 2018-10-03 ENCOUNTER — Encounter (HOSPITAL_COMMUNITY): Payer: Self-pay

## 2018-10-03 ENCOUNTER — Other Ambulatory Visit: Payer: Self-pay

## 2018-10-03 DIAGNOSIS — J45909 Unspecified asthma, uncomplicated: Secondary | ICD-10-CM | POA: Diagnosis present

## 2018-10-03 DIAGNOSIS — Z1159 Encounter for screening for other viral diseases: Secondary | ICD-10-CM | POA: Diagnosis not present

## 2018-10-03 DIAGNOSIS — O9952 Diseases of the respiratory system complicating childbirth: Secondary | ICD-10-CM | POA: Diagnosis present

## 2018-10-03 DIAGNOSIS — G43909 Migraine, unspecified, not intractable, without status migrainosus: Secondary | ICD-10-CM | POA: Diagnosis present

## 2018-10-03 DIAGNOSIS — Z3A37 37 weeks gestation of pregnancy: Secondary | ICD-10-CM | POA: Diagnosis not present

## 2018-10-03 DIAGNOSIS — O134 Gestational [pregnancy-induced] hypertension without significant proteinuria, complicating childbirth: Secondary | ICD-10-CM | POA: Diagnosis present

## 2018-10-03 DIAGNOSIS — O99214 Obesity complicating childbirth: Secondary | ICD-10-CM | POA: Diagnosis present

## 2018-10-03 DIAGNOSIS — O99354 Diseases of the nervous system complicating childbirth: Secondary | ICD-10-CM | POA: Diagnosis present

## 2018-10-03 DIAGNOSIS — Z87891 Personal history of nicotine dependence: Secondary | ICD-10-CM | POA: Diagnosis not present

## 2018-10-03 DIAGNOSIS — E669 Obesity, unspecified: Secondary | ICD-10-CM | POA: Diagnosis present

## 2018-10-03 DIAGNOSIS — O139 Gestational [pregnancy-induced] hypertension without significant proteinuria, unspecified trimester: Secondary | ICD-10-CM | POA: Diagnosis present

## 2018-10-03 LAB — URINALYSIS, ROUTINE W REFLEX MICROSCOPIC
Bilirubin Urine: NEGATIVE
Glucose, UA: NEGATIVE mg/dL
Hgb urine dipstick: NEGATIVE
Ketones, ur: NEGATIVE mg/dL
Nitrite: NEGATIVE
Protein, ur: NEGATIVE mg/dL
Specific Gravity, Urine: 1.018 (ref 1.005–1.030)
pH: 6 (ref 5.0–8.0)

## 2018-10-03 LAB — CBC WITH DIFFERENTIAL/PLATELET
Abs Immature Granulocytes: 0.04 10*3/uL (ref 0.00–0.07)
Basophils Absolute: 0 10*3/uL (ref 0.0–0.1)
Basophils Relative: 0 %
Eosinophils Absolute: 0.1 10*3/uL (ref 0.0–0.5)
Eosinophils Relative: 1 %
HCT: 29.2 % — ABNORMAL LOW (ref 36.0–46.0)
Hemoglobin: 10 g/dL — ABNORMAL LOW (ref 12.0–15.0)
Immature Granulocytes: 1 %
Lymphocytes Relative: 25 %
Lymphs Abs: 2 10*3/uL (ref 0.7–4.0)
MCH: 25.3 pg — ABNORMAL LOW (ref 26.0–34.0)
MCHC: 34.2 g/dL (ref 30.0–36.0)
MCV: 73.9 fL — ABNORMAL LOW (ref 80.0–100.0)
Monocytes Absolute: 0.9 10*3/uL (ref 0.1–1.0)
Monocytes Relative: 11 %
Neutro Abs: 5 10*3/uL (ref 1.7–7.7)
Neutrophils Relative %: 62 %
Platelets: 271 10*3/uL (ref 150–400)
RBC: 3.95 MIL/uL (ref 3.87–5.11)
RDW: 15.9 % — ABNORMAL HIGH (ref 11.5–15.5)
WBC: 8 10*3/uL (ref 4.0–10.5)
nRBC: 0 % (ref 0.0–0.2)

## 2018-10-03 LAB — COMPREHENSIVE METABOLIC PANEL
ALT: 13 U/L (ref 0–44)
AST: 15 U/L (ref 15–41)
Albumin: 2.6 g/dL — ABNORMAL LOW (ref 3.5–5.0)
Alkaline Phosphatase: 68 U/L (ref 38–126)
Anion gap: 5 (ref 5–15)
BUN: 6 mg/dL (ref 6–20)
CO2: 23 mmol/L (ref 22–32)
Calcium: 8.7 mg/dL — ABNORMAL LOW (ref 8.9–10.3)
Chloride: 108 mmol/L (ref 98–111)
Creatinine, Ser: 0.5 mg/dL (ref 0.44–1.00)
GFR calc Af Amer: >60 mL/min (ref >60)
GFR calc non Af Amer: >60 mL/min (ref >60)
Glucose, Bld: 103 mg/dL — ABNORMAL HIGH (ref 70–99)
Potassium: 3.5 mmol/L (ref 3.5–5.1)
Sodium: 136 mmol/L (ref 135–145)
Total Bilirubin: 0.4 mg/dL (ref 0.3–1.2)
Total Protein: 5.5 g/dL — ABNORMAL LOW (ref 6.5–8.1)

## 2018-10-03 LAB — CBC
HCT: 29.8 % — ABNORMAL LOW (ref 36.0–46.0)
Hemoglobin: 10.1 g/dL — ABNORMAL LOW (ref 12.0–15.0)
MCH: 25.1 pg — ABNORMAL LOW (ref 26.0–34.0)
MCHC: 33.9 g/dL (ref 30.0–36.0)
MCV: 73.9 fL — ABNORMAL LOW (ref 80.0–100.0)
Platelets: 268 10*3/uL (ref 150–400)
RBC: 4.03 MIL/uL (ref 3.87–5.11)
RDW: 16.1 % — ABNORMAL HIGH (ref 11.5–15.5)
WBC: 7.7 10*3/uL (ref 4.0–10.5)
nRBC: 0 % (ref 0.0–0.2)

## 2018-10-03 LAB — TYPE AND SCREEN
ABO/RH(D): O POS
Antibody Screen: NEGATIVE

## 2018-10-03 LAB — LACTATE DEHYDROGENASE: LDH: 107 U/L (ref 98–192)

## 2018-10-03 LAB — URIC ACID: Uric Acid, Serum: 4.2 mg/dL (ref 2.5–7.1)

## 2018-10-03 LAB — PROTEIN / CREATININE RATIO, URINE
Creatinine, Urine: 129.98 mg/dL
Protein Creatinine Ratio: 0.12 mg/mg{Cre} (ref 0.00–0.15)
Total Protein, Urine: 16 mg/dL

## 2018-10-03 LAB — RPR: RPR Ser Ql: NONREACTIVE

## 2018-10-03 LAB — SARS CORONAVIRUS 2 BY RT PCR (HOSPITAL ORDER, PERFORMED IN ~~LOC~~ HOSPITAL LAB): SARS Coronavirus 2: NEGATIVE

## 2018-10-03 LAB — ABO/RH: ABO/RH(D): O POS

## 2018-10-03 SURGERY — Surgical Case
Anesthesia: Epidural

## 2018-10-03 MED ORDER — KETOROLAC TROMETHAMINE 30 MG/ML IJ SOLN
INTRAMUSCULAR | Status: AC
  Filled 2018-10-03: qty 1

## 2018-10-03 MED ORDER — DEXTROSE 5 % IV SOLN
INTRAVENOUS | Status: DC | PRN
  Administered 2018-10-03: 13:00:00 3 g via INTRAVENOUS

## 2018-10-03 MED ORDER — DIPHENHYDRAMINE HCL 50 MG/ML IJ SOLN: 12.5000 mg | INTRAMUSCULAR | Status: DC | PRN

## 2018-10-03 MED ORDER — KETOROLAC TROMETHAMINE 30 MG/ML IJ SOLN: 30.0000 mg | Freq: Four times a day (QID) | INTRAMUSCULAR | Status: AC | PRN

## 2018-10-03 MED ORDER — SIMETHICONE 80 MG PO CHEW
80.0000 mg | CHEWABLE_TABLET | ORAL | Status: DC
  Administered 2018-10-03 – 2018-10-04 (×2): 80 mg via ORAL
  Filled 2018-10-03 (×2): qty 1

## 2018-10-03 MED ORDER — OXYTOCIN 40 UNITS IN NORMAL SALINE INFUSION - SIMPLE MED
1.0000 m[IU]/min | INTRAVENOUS | Status: DC
  Administered 2018-10-03: 2 m[IU]/min via INTRAVENOUS
  Filled 2018-10-03: qty 1000

## 2018-10-03 MED ORDER — ACETAMINOPHEN 10 MG/ML IV SOLN
1000.0000 mg | Freq: Once | INTRAVENOUS | Status: DC | PRN
  Filled 2018-10-03: qty 100

## 2018-10-03 MED ORDER — FENTANYL CITRATE (PF) 100 MCG/2ML IJ SOLN
50.0000 ug | INTRAMUSCULAR | Status: DC | PRN
  Administered 2018-10-03: 50 ug via INTRAVENOUS
  Filled 2018-10-03: qty 2

## 2018-10-03 MED ORDER — OXYTOCIN 40 UNITS IN NORMAL SALINE INFUSION - SIMPLE MED
INTRAVENOUS | Status: AC
  Filled 2018-10-03: qty 1000

## 2018-10-03 MED ORDER — LACTATED RINGERS IV SOLN: 500.0000 mL | Freq: Once | INTRAVENOUS | Status: DC

## 2018-10-03 MED ORDER — FENTANYL-BUPIVACAINE-NACL 0.5-0.125-0.9 MG/250ML-% EP SOLN
12.0000 mL/h | EPIDURAL | Status: DC | PRN
  Filled 2018-10-03: qty 250

## 2018-10-03 MED ORDER — LIDOCAINE HCL (PF) 1 % IJ SOLN
INTRAMUSCULAR | Status: DC | PRN
  Administered 2018-10-03 (×2): 5 mL via EPIDURAL

## 2018-10-03 MED ORDER — NALOXONE HCL 0.4 MG/ML IJ SOLN: 0.4000 mg | INTRAMUSCULAR | Status: DC | PRN

## 2018-10-03 MED ORDER — COCONUT OIL OIL: 1.0000 "application " | TOPICAL_OIL | Status: DC | PRN

## 2018-10-03 MED ORDER — NALBUPHINE HCL 10 MG/ML IJ SOLN: 5.0000 mg | Freq: Once | INTRAMUSCULAR | Status: DC | PRN

## 2018-10-03 MED ORDER — TRAMADOL HCL 50 MG PO TABS
50.0000 mg | ORAL_TABLET | Freq: Four times a day (QID) | ORAL | Status: DC | PRN
  Administered 2018-10-04 – 2018-10-05 (×4): 50 mg via ORAL
  Filled 2018-10-03 (×4): qty 1

## 2018-10-03 MED ORDER — MORPHINE SULFATE (PF) 0.5 MG/ML IJ SOLN
INTRAMUSCULAR | Status: DC | PRN
  Administered 2018-10-03: 3 mg via EPIDURAL

## 2018-10-03 MED ORDER — PROMETHAZINE HCL 25 MG/ML IJ SOLN: 6.2500 mg | INTRAMUSCULAR | Status: DC | PRN

## 2018-10-03 MED ORDER — SODIUM CHLORIDE 0.9% FLUSH: 3.0000 mL | INTRAVENOUS | Status: DC | PRN

## 2018-10-03 MED ORDER — LACTATED RINGERS IV SOLN
INTRAVENOUS | Status: DC | PRN
  Administered 2018-10-03: 13:00:00 via INTRAVENOUS

## 2018-10-03 MED ORDER — OXYTOCIN BOLUS FROM INFUSION
500.0000 mL | Freq: Once | INTRAVENOUS | Status: AC
  Administered 2018-10-03: 500 mL via INTRAVENOUS

## 2018-10-03 MED ORDER — DIBUCAINE (PERIANAL) 1 % EX OINT: 1.0000 "application " | TOPICAL_OINTMENT | CUTANEOUS | Status: DC | PRN

## 2018-10-03 MED ORDER — MEPERIDINE HCL 25 MG/ML IJ SOLN: 6.2500 mg | INTRAMUSCULAR | Status: DC | PRN

## 2018-10-03 MED ORDER — OXYCODONE HCL 5 MG PO TABS
5.0000 mg | ORAL_TABLET | Freq: Four times a day (QID) | ORAL | Status: DC | PRN
  Administered 2018-10-03 – 2018-10-05 (×6): 5 mg via ORAL
  Filled 2018-10-03 (×6): qty 1

## 2018-10-03 MED ORDER — SIMETHICONE 80 MG PO CHEW: 80.0000 mg | CHEWABLE_TABLET | ORAL | Status: DC | PRN

## 2018-10-03 MED ORDER — MENTHOL 3 MG MT LOZG: 1.0000 | LOZENGE | OROMUCOSAL | Status: DC | PRN

## 2018-10-03 MED ORDER — PHENYLEPHRINE 40 MCG/ML (10ML) SYRINGE FOR IV PUSH (FOR BLOOD PRESSURE SUPPORT)
80.0000 ug | PREFILLED_SYRINGE | INTRAVENOUS | Status: DC | PRN
  Filled 2018-10-03: qty 10

## 2018-10-03 MED ORDER — SIMETHICONE 80 MG PO CHEW
80.0000 mg | CHEWABLE_TABLET | Freq: Three times a day (TID) | ORAL | Status: DC
  Administered 2018-10-04 – 2018-10-05 (×4): 80 mg via ORAL
  Filled 2018-10-03 (×4): qty 1

## 2018-10-03 MED ORDER — WITCH HAZEL-GLYCERIN EX PADS: 1.0000 "application " | MEDICATED_PAD | CUTANEOUS | Status: DC | PRN

## 2018-10-03 MED ORDER — ONDANSETRON HCL 4 MG/2ML IJ SOLN: 4.0000 mg | Freq: Four times a day (QID) | INTRAMUSCULAR | Status: DC | PRN

## 2018-10-03 MED ORDER — LIDOCAINE-EPINEPHRINE (PF) 2 %-1:200000 IJ SOLN
INTRAMUSCULAR | Status: AC
  Filled 2018-10-03: qty 20

## 2018-10-03 MED ORDER — DIPHENHYDRAMINE HCL 25 MG PO CAPS: 25.0000 mg | ORAL_CAPSULE | Freq: Four times a day (QID) | ORAL | Status: DC | PRN

## 2018-10-03 MED ORDER — FENTANYL-BUPIVACAINE-NACL 0.5-0.125-0.9 MG/250ML-% EP SOLN: 12.0000 mL/h | EPIDURAL | Status: DC | PRN

## 2018-10-03 MED ORDER — PHENYLEPHRINE 40 MCG/ML (10ML) SYRINGE FOR IV PUSH (FOR BLOOD PRESSURE SUPPORT): 80.0000 ug | PREFILLED_SYRINGE | INTRAVENOUS | Status: DC | PRN

## 2018-10-03 MED ORDER — EPHEDRINE 5 MG/ML INJ: 10.0000 mg | INTRAVENOUS | Status: DC | PRN

## 2018-10-03 MED ORDER — SCOPOLAMINE 1 MG/3DAYS TD PT72
1.0000 | MEDICATED_PATCH | Freq: Once | TRANSDERMAL | Status: DC
  Filled 2018-10-03: qty 1

## 2018-10-03 MED ORDER — FENTANYL CITRATE (PF) 100 MCG/2ML IJ SOLN
25.0000 ug | INTRAMUSCULAR | Status: DC | PRN
  Administered 2018-10-03: 25 ug via INTRAVENOUS

## 2018-10-03 MED ORDER — FENTANYL CITRATE (PF) 100 MCG/2ML IJ SOLN
INTRAMUSCULAR | Status: AC
  Filled 2018-10-03: qty 2

## 2018-10-03 MED ORDER — BUTALBITAL-APAP-CAFFEINE 50-325-40 MG PO TABS
1.0000 | ORAL_TABLET | ORAL | Status: DC | PRN
  Filled 2018-10-03: qty 1

## 2018-10-03 MED ORDER — LIDOCAINE-EPINEPHRINE (PF) 2 %-1:200000 IJ SOLN
INTRAMUSCULAR | Status: DC | PRN
  Administered 2018-10-03 (×3): 5 mL via EPIDURAL

## 2018-10-03 MED ORDER — OXYCODONE-ACETAMINOPHEN 5-325 MG PO TABS: 2.0000 | ORAL_TABLET | ORAL | Status: DC | PRN

## 2018-10-03 MED ORDER — TERBUTALINE SULFATE 1 MG/ML IJ SOLN: 0.2500 mg | Freq: Once | INTRAMUSCULAR | Status: DC | PRN

## 2018-10-03 MED ORDER — NALOXONE HCL 4 MG/10ML IJ SOLN
1.0000 ug/kg/h | INTRAVENOUS | Status: DC | PRN
  Filled 2018-10-03: qty 5

## 2018-10-03 MED ORDER — ZOLPIDEM TARTRATE 5 MG PO TABS: 5.0000 mg | ORAL_TABLET | Freq: Every evening | ORAL | Status: DC | PRN

## 2018-10-03 MED ORDER — LACTATED RINGERS IV SOLN: INTRAVENOUS | Status: DC

## 2018-10-03 MED ORDER — DIPHENHYDRAMINE HCL 25 MG PO CAPS: 25.0000 mg | ORAL_CAPSULE | ORAL | Status: DC | PRN

## 2018-10-03 MED ORDER — ACETAMINOPHEN 160 MG/5ML PO SOLN: 325.0000 mg | Freq: Once | ORAL | Status: DC | PRN

## 2018-10-03 MED ORDER — ONDANSETRON HCL 4 MG/2ML IJ SOLN: 4.0000 mg | Freq: Three times a day (TID) | INTRAMUSCULAR | Status: DC | PRN

## 2018-10-03 MED ORDER — LACTATED RINGERS IV SOLN
INTRAVENOUS | Status: DC
  Administered 2018-10-03 (×2): via INTRAVENOUS

## 2018-10-03 MED ORDER — SENNOSIDES-DOCUSATE SODIUM 8.6-50 MG PO TABS
2.0000 | ORAL_TABLET | ORAL | Status: DC
  Administered 2018-10-03 – 2018-10-04 (×2): 2 via ORAL
  Filled 2018-10-03 (×2): qty 2

## 2018-10-03 MED ORDER — NALBUPHINE HCL 10 MG/ML IJ SOLN: 5.0000 mg | INTRAMUSCULAR | Status: DC | PRN

## 2018-10-03 MED ORDER — LIDOCAINE HCL (PF) 1 % IJ SOLN: 30.0000 mL | INTRAMUSCULAR | Status: DC | PRN

## 2018-10-03 MED ORDER — SODIUM CHLORIDE (PF) 0.9 % IJ SOLN
INTRAMUSCULAR | Status: DC | PRN
  Administered 2018-10-03: 14 mL/h via EPIDURAL

## 2018-10-03 MED ORDER — OXYTOCIN 40 UNITS IN NORMAL SALINE INFUSION - SIMPLE MED: 2.5000 [IU]/h | INTRAVENOUS | Status: DC

## 2018-10-03 MED ORDER — ONDANSETRON HCL 4 MG/2ML IJ SOLN
INTRAMUSCULAR | Status: AC
  Filled 2018-10-03: qty 2

## 2018-10-03 MED ORDER — OXYTOCIN 40 UNITS IN NORMAL SALINE INFUSION - SIMPLE MED: 2.5000 [IU]/h | INTRAVENOUS | Status: AC

## 2018-10-03 MED ORDER — ONDANSETRON HCL 4 MG/2ML IJ SOLN
INTRAMUSCULAR | Status: DC | PRN
  Administered 2018-10-03: 4 mg via INTRAVENOUS

## 2018-10-03 MED ORDER — ACETAMINOPHEN 325 MG PO TABS: 325.0000 mg | ORAL_TABLET | Freq: Once | ORAL | Status: DC | PRN

## 2018-10-03 MED ORDER — SOD CITRATE-CITRIC ACID 500-334 MG/5ML PO SOLN
30.0000 mL | ORAL | Status: DC | PRN
  Administered 2018-10-03: 30 mL via ORAL
  Filled 2018-10-03: qty 30

## 2018-10-03 MED ORDER — LACTATED RINGERS IV SOLN: 500.0000 mL | INTRAVENOUS | Status: DC | PRN

## 2018-10-03 MED ORDER — ENOXAPARIN SODIUM 60 MG/0.6ML ~~LOC~~ SOLN
60.0000 mg | SUBCUTANEOUS | Status: DC
  Administered 2018-10-04: 60 mg via SUBCUTANEOUS
  Filled 2018-10-03: qty 0.6

## 2018-10-03 MED ORDER — SODIUM CHLORIDE 0.9 % IV SOLN
INTRAVENOUS | Status: DC | PRN
  Administered 2018-10-03: 40 [IU] via INTRAVENOUS

## 2018-10-03 MED ORDER — DEXTROSE 5 % IV SOLN
INTRAVENOUS | Status: AC
  Filled 2018-10-03: qty 3000

## 2018-10-03 MED ORDER — SODIUM CHLORIDE 0.9 % IR SOLN
Status: DC | PRN
  Administered 2018-10-03: 1000 mL

## 2018-10-03 MED ORDER — LACTATED RINGERS IV SOLN
INTRAVENOUS | Status: DC
  Administered 2018-10-03 – 2018-10-04 (×2): via INTRAVENOUS

## 2018-10-03 MED ORDER — MORPHINE SULFATE (PF) 0.5 MG/ML IJ SOLN
INTRAMUSCULAR | Status: AC
  Filled 2018-10-03: qty 10

## 2018-10-03 MED ORDER — KETOROLAC TROMETHAMINE 30 MG/ML IJ SOLN
30.0000 mg | Freq: Four times a day (QID) | INTRAMUSCULAR | Status: AC | PRN
  Administered 2018-10-03 – 2018-10-04 (×3): 30 mg via INTRAVENOUS
  Filled 2018-10-03 (×2): qty 1

## 2018-10-03 MED ORDER — FENTANYL CITRATE (PF) 100 MCG/2ML IJ SOLN
INTRAMUSCULAR | Status: DC | PRN
  Administered 2018-10-03: 100 ug via EPIDURAL

## 2018-10-03 MED ORDER — PRENATAL MULTIVITAMIN CH
1.0000 | ORAL_TABLET | Freq: Every day | ORAL | Status: DC
  Administered 2018-10-04 – 2018-10-05 (×2): 1 via ORAL
  Filled 2018-10-03 (×2): qty 1

## 2018-10-03 MED ORDER — ACETAMINOPHEN 325 MG PO TABS: 650.0000 mg | ORAL_TABLET | ORAL | Status: DC | PRN

## 2018-10-03 MED ORDER — OXYCODONE-ACETAMINOPHEN 5-325 MG PO TABS: 1.0000 | ORAL_TABLET | ORAL | Status: DC | PRN

## 2018-10-03 SURGICAL SUPPLY — 38 items
BARRIER ADHS 3X4 INTERCEED (GAUZE/BANDAGES/DRESSINGS) ×3 IMPLANT
BENZOIN TINCTURE PRP APPL 2/3 (GAUZE/BANDAGES/DRESSINGS) ×3 IMPLANT
CHLORAPREP W/TINT 26ML (MISCELLANEOUS) ×3 IMPLANT
CLAMP CORD UMBIL (MISCELLANEOUS) IMPLANT
CLOSURE STERI STRIP 1/2 X4 (GAUZE/BANDAGES/DRESSINGS) ×2 IMPLANT
CLOSURE WOUND 1/2 X4 (GAUZE/BANDAGES/DRESSINGS) ×1
CLOTH BEACON ORANGE TIMEOUT ST (SAFETY) ×3 IMPLANT
DERMABOND ADVANCED (GAUZE/BANDAGES/DRESSINGS)
DERMABOND ADVANCED .7 DNX12 (GAUZE/BANDAGES/DRESSINGS) IMPLANT
DRSG OPSITE POSTOP 4X10 (GAUZE/BANDAGES/DRESSINGS) ×3 IMPLANT
ELECT REM PT RETURN 9FT ADLT (ELECTROSURGICAL) ×3
ELECTRODE REM PT RTRN 9FT ADLT (ELECTROSURGICAL) ×1 IMPLANT
EXTRACTOR VACUUM KIWI (MISCELLANEOUS) ×3 IMPLANT
GLOVE BIOGEL PI IND STRL 7.0 (GLOVE) ×3 IMPLANT
GLOVE BIOGEL PI INDICATOR 7.0 (GLOVE) ×6
GLOVE ECLIPSE 6.5 STRL STRAW (GLOVE) ×3 IMPLANT
GOWN STRL REUS W/TWL LRG LVL3 (GOWN DISPOSABLE) ×9 IMPLANT
KIT ABG SYR 3ML LUER SLIP (SYRINGE) ×3 IMPLANT
NEEDLE HYPO 25X5/8 SAFETYGLIDE (NEEDLE) ×3 IMPLANT
NS IRRIG 1000ML POUR BTL (IV SOLUTION) ×3 IMPLANT
PACK C SECTION WH (CUSTOM PROCEDURE TRAY) ×3 IMPLANT
PAD ABD 7.5X8 STRL (GAUZE/BANDAGES/DRESSINGS) ×3 IMPLANT
PAD OB MATERNITY 4.3X12.25 (PERSONAL CARE ITEMS) ×3 IMPLANT
PENCIL SMOKE EVAC W/HOLSTER (ELECTROSURGICAL) ×3 IMPLANT
RTRCTR C-SECT PINK 25CM LRG (MISCELLANEOUS) ×3 IMPLANT
STRIP CLOSURE SKIN 1/2X4 (GAUZE/BANDAGES/DRESSINGS) ×2 IMPLANT
SUT PLAIN 0 NONE (SUTURE) IMPLANT
SUT PLAIN 2 0 XLH (SUTURE) IMPLANT
SUT VIC AB 0 CT1 27 (SUTURE) ×4
SUT VIC AB 0 CT1 27XBRD ANBCTR (SUTURE) ×2 IMPLANT
SUT VIC AB 0 CTX 36 (SUTURE) ×6
SUT VIC AB 0 CTX36XBRD ANBCTRL (SUTURE) ×3 IMPLANT
SUT VIC AB 2-0 CT1 27 (SUTURE) ×4
SUT VIC AB 2-0 CT1 TAPERPNT 27 (SUTURE) ×2 IMPLANT
SUT VIC AB 4-0 KS 27 (SUTURE) ×3 IMPLANT
TOWEL OR 17X24 6PK STRL BLUE (TOWEL DISPOSABLE) ×3 IMPLANT
TRAY FOLEY W/BAG SLVR 14FR LF (SET/KITS/TRAYS/PACK) IMPLANT
WATER STERILE IRR 1000ML POUR (IV SOLUTION) ×3 IMPLANT

## 2018-10-03 NOTE — H&P (Signed)
Mackenzie Maxwell is a 28 y.o. female presenting for induction of labor due to gestational hypertension. OB History    Gravida  2   Para  1   Term  1   Preterm      AB      Living  1     SAB      TAB      Ectopic      Multiple      Live Births  1        Obstetric Comments  Hemorrhage of left adrenal gland        Past Medical History:  Diagnosis Date  . Asthma   . Gestational hypertension    Past Surgical History:  Procedure Laterality Date  . TONSILLECTOMY     Family History: family history includes Diabetes in her maternal grandmother; Hypertension in her maternal grandmother. Social History:  reports that she quit smoking about 10 years ago. Her smoking use included cigarettes. She smoked 1.00 pack per day. She has never used smokeless tobacco. She reports previous alcohol use. She reports that she does not use drugs.     Maternal Diabetes: No Genetic Screening: Declined Maternal Ultrasounds/Referrals: Normal Fetal Ultrasounds or other Referrals:  None Maternal Substance Abuse:  No Significant Maternal Medications:  None Significant Maternal Lab Results:  None Other Comments:  None  Review of Systems  Eyes: Negative for blurred vision.  Gastrointestinal: Negative for abdominal pain.  Neurological: Positive for headaches.  All other systems reviewed and are negative.  Maternal Medical History:  Fetal activity: Perceived fetal activity is normal.   Last perceived fetal movement was within the past hour.    Prenatal complications: PIH.   Prenatal Complications - Diabetes: none.    Dilation: 3 Effacement (%): 50 Exam by:: Vickki Hearing CNM   Vitals:   10/03/18 0050 10/03/18 0100 10/03/18 0115 10/03/18 0130  BP: 132/81 135/90 139/90 139/90  Pulse: 92 92 92 92  Resp:    20  Temp:    98.8 F (37.1 C)  TempSrc:    Oral  SpO2:    100%  Weight:      Height:        Maternal Exam:  Abdomen: Patient reports no abdominal tenderness. Fundal height is  Size=dates.   Estimated fetal weight is 6.5lbs.   Fetal presentation: vertex  Introitus: Normal vulva. Normal vagina.  Vagina is negative for discharge.  Pelvis: adequate for delivery.   Cervix: Cervix evaluated by digital exam.     Fetal Exam Fetal Monitor Review: Mode: ultrasound.   Baseline rate: 120s.  Variability: moderate (6-25 bpm).   Pattern: accelerations present.    Fetal State Assessment: Category I - tracings are normal.     Physical Exam  Nursing note and vitals reviewed. Constitutional: She is oriented to person, place, and time. She appears well-developed and well-nourished.  HENT:  Head: Normocephalic and atraumatic.  Eyes: Pupils are equal, round, and reactive to light.  Cardiovascular: Normal rate, regular rhythm and normal heart sounds.  Respiratory: Effort normal and breath sounds normal. No respiratory distress.  GI: There is no abdominal tenderness.  Genitourinary:    Vulva, vagina and uterus normal.     No vaginal discharge.   Musculoskeletal: Normal range of motion.  Neurological: She is alert and oriented to person, place, and time. She has normal reflexes.  Skin: Skin is warm and dry.  Psychiatric: She has a normal mood and affect. Her behavior is normal. Judgment and  thought content normal.    Prenatal labs: ABO, Rh:  O+ Antibody:  Negative Rubella:  Nonimmune RPR:   NR HBsAg:   NR HIV:   NR GBS:   Negative   Assessment/Plan: 28 y.o. G2P1 at [redacted]w[redacted]d Gestational hypertension Category 1 FHTs, reactive NST Consulted Dr. Nelda Marseille regarding plan of care Admit to L&D for pitocin induction of labor for gestational hypertension  Fioricet for headache Labetalol protocol PRN Anticipate NSVD    [name] 10/03/2018, 1:38 AM

## 2018-10-03 NOTE — Anesthesia Procedure Notes (Signed)
Epidural Patient location during procedure: OB Start time: 10/03/2018 8:42 AM End time: 10/03/2018 8:48 AM  Staffing Anesthesiologist: Effie Berkshire, MD Performed: anesthesiologist   Preanesthetic Checklist Completed: patient identified, site marked, surgical consent, pre-op evaluation, timeout performed, IV checked, risks and benefits discussed and monitors and equipment checked  Epidural Patient position: sitting Prep: ChloraPrep Patient monitoring: heart rate, continuous pulse ox and blood pressure Approach: midline Location: L3-L4 Injection technique: LOR saline  Needle:  Needle type: Tuohy  Needle gauge: 17 G Needle length: 9 cm Catheter type: closed end flexible Catheter size: 20 Guage Test dose: negative and 1.5% lidocaine  Assessment Events: blood not aspirated, injection not painful, no injection resistance and no paresthesia  Additional Notes LOR @ 5  Patient identified. Risks/Benefits/Options discussed with patient including but not limited to bleeding, infection, nerve damage, paralysis, failed block, incomplete pain control, headache, blood pressure changes, nausea, vomiting, reactions to medications, itching and postpartum back pain. Confirmed with bedside nurse the patient's most recent platelet count. Confirmed with patient that they are not currently taking any anticoagulation, have any bleeding history or any family history of bleeding disorders. Patient expressed understanding and wished to proceed. All questions were answered. Sterile technique was used throughout the entire procedure. Please see nursing notes for vital signs. Test dose was given through epidural catheter and negative prior to continuing to dose epidural or start infusion. Warning signs of high block given to the patient including shortness of breath, tingling/numbness in hands, complete motor block, or any concerning symptoms with instructions to call for help. Patient was given instructions on  fall risk and not to get out of bed. All questions and concerns addressed with instructions to call with any issues or inadequate analgesia.    Reason for block:procedure for pain

## 2018-10-03 NOTE — Anesthesia Preprocedure Evaluation (Addendum)
Anesthesia Evaluation   Patient awake    Reviewed: Allergy & Precautions, Patient's Chart, lab work & pertinent test results  Airway Mallampati: III       Dental no notable dental hx.    Pulmonary asthma , former smoker,    Pulmonary exam normal        Cardiovascular hypertension, Normal cardiovascular exam     Neuro/Psych  Headaches, Depression    GI/Hepatic negative GI ROS, Neg liver ROS,   Endo/Other  negative endocrine ROS  Renal/GU negative Renal ROS     Musculoskeletal negative musculoskeletal ROS (+)   Abdominal Normal abdominal exam  (+)   Peds  Hematology negative hematology ROS (+)   Anesthesia Other Findings   Reproductive/Obstetrics (+) Pregnancy                             Anesthesia Physical Anesthesia Plan  ASA: III  Anesthesia Plan: Epidural   Post-op Pain Management:    Induction:   PONV Risk Score and Plan:   Airway Management Planned:   Additional Equipment: None  Intra-op Plan:   Post-operative Plan:   Informed Consent: I have reviewed the patients History and Physical, chart, labs and discussed the procedure including the risks, benefits and alternatives for the proposed anesthesia with the patient or authorized representative who has indicated his/her understanding and acceptance.       Plan Discussed with:   Anesthesia Plan Comments: (Lab Results      Component                Value               Date                      WBC                      7.7                 10/03/2018                HGB                      10.1 (L)            10/03/2018                HCT                      29.8 (L)            10/03/2018                MCV                      73.9 (L)            10/03/2018                PLT                      268                 10/03/2018           )        Anesthesia Quick Evaluation

## 2018-10-03 NOTE — Progress Notes (Signed)
Labor Progress Note  Mackenzie Maxwell, 28 y.o., G2P1001, with an IUP @ [redacted]w[redacted]d, presented for IOL for GHTN,. Not medicated.   Subjective: Pt resting in bed alone in room. Discussed R&B about AROM, pt progressed on 24 pit to 4cm, Cat 1, no decel noted when fetal head palpated. Pt aware of risk of cord prolapse and emergency CS if that should occur, pt verbalized consent for procedure. I AROM her @ 9702, clear water with small hole, fetus tolerated well, remained Cat 1, when water slowly tricked out I felt a cord that was palpating in the anterior cervix at 2 oclock. I kept my hand in the vaginas, RN Called for help, Cat 1 at this time, I asked the RN to place the pt in trendelenburg, stop the pitocin and give terbutaline. Cat 1 still at this time, an RN came in and placed her hand in side of the pts vagina at the same time I took mine out, Cat 1 remained at this time. I called Dr Nelda Marseille and informed her of the prolapsed cord,  @ 46, Dr Nelda Marseille was in route to the hospital and called to be updated about cat 1 strip @ 1238, I read the pt the risk and benefit of a CS at this point, pt consented. Risk included infection, bleeding, infection, damage to internal organs. Pt was prepped for CS and brought back to the OR immediately, pot had working epidural with no pain. Pt emotionally stable, plan of care updated and she denies any questions. Husband called to come back for CS.  Patient Active Problem List   Diagnosis Date Noted  . Gestational hypertension 10/03/2018  . Pregnancy-induced hypertension 09/29/2018  . Depressive disorder 04/16/2018  . Rubella non-immune status, antepartum 04/02/2018  . 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 Migraine 02/14/2016  . Asthma 10/02/2011   Objective: BP (!) 124/58   Pulse (!) 110   Temp (!) 97.5 F (36.4 C)   Resp 20   Ht 5\' 5"  (1.651 m)   Wt 120.9 kg   SpO2 99%   BMI 44.35 kg/m  No intake/output data recorded. Total  I/O In: 400 [I.V.:400] Out: 501 [Urine:20; Blood:481] NST: FHR baseline 125 bpm, Variability: moderate, Accelerations:present, Decelerations:  Absent = Cat 1/Reactive CTX:  irregular, every 2-4 minutes, lasting 60 seconds Uterus gravid, soft non tender, moderate to palpate with contractions.  SVE:  Dilation: 4 Effacement (%): 50 Station: -2 Exam by:: Luvenia Starch, CNM Pitocin at 14 mUn/min  Assessment:  Mackenzie Maxwell, 28 y.o., G2P1001, with an IUP @ [redacted]w[redacted]d, presented for IOL for GHTN,. Not medicated. Pt has H/o Obesity BMI 44, asthma, depression and anxiety , no meds, and migraines. Pt feeling now cxt, wants epidural. Progressing on pitocin. AROM performed cord prolapse noted DR Nelda Marseille called and in route for PCS. Cat 1.  Pt updated, see subjected for further details.  Patient Active Problem List   Diagnosis Date Noted  . Gestational hypertension 10/03/2018  . Pregnancy-induced hypertension 09/29/2018  . Depressive disorder 04/16/2018  . Rubella non-immune status, antepartum 04/02/2018  . 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 Migraine 02/14/2016  . Asthma 10/02/2011   NICHD: Category 1  Membranes:  AROM, cord prolapse, clear fluids, no s/s of infection  Induction:    Cytotec x0, cervic started at 3 cm  Foley Bulb: No  Pitocin - 26  Pain management:  IV pain management: x1 @ 0519, 48mcg fentanyl              Epidural placement:  Placed @ 1500  GBS Negative  GHTN: BP 124/58, asymptomatic, no meds.   Migraines: 5/10 pain, declined meds, will stay hydrated with monitoring. Fioricet for pain last given at 0001.    Plan: Primary CS for cord prolapse.  Continuous monitoring RN to keep hand vagina and hold head of fetus off cord.  Pitocin discontinued. Trendelenburg Terbutaline  Anticipate PCS Stat. Dr Nelda Marseille in route. Cat 1.   Md Ozan aware of plan and verbalized agreement.   Noralyn Pick, NP-C, CNM, MSN 10/03/2018. 3:00  PM

## 2018-10-03 NOTE — MAU Note (Signed)
Mackenzie Maxwell CNM at bedside. Per pt, GBS is negative. No GBS result on file. L&D charge notified.   Gilmer Mor RN

## 2018-10-03 NOTE — Anesthesia Postprocedure Evaluation (Signed)
Anesthesia Post Note  Patient: Mackenzie Maxwell  Procedure(s) Performed: CESAREAN SECTION (N/A )     Patient location during evaluation: PACU Anesthesia Type: Epidural Level of consciousness: oriented and awake and alert Pain management: pain level controlled Vital Signs Assessment: post-procedure vital signs reviewed and stable Respiratory status: spontaneous breathing, respiratory function stable and patient connected to nasal cannula oxygen Cardiovascular status: blood pressure returned to baseline and stable Postop Assessment: no headache, no backache, no apparent nausea or vomiting and epidural receding Anesthetic complications: no    Last Vitals:  Vitals:   10/03/18 1500 10/03/18 1530  BP: (!) 142/72 (!) 149/73  Pulse: 76 80  Resp: 13 14  Temp:    SpO2: 99% 100%    Last Pain:  Vitals:   10/03/18 1500  TempSrc:   PainSc: 2    Pain Goal: Patients Stated Pain Goal: 2 (10/03/18 0515) Effie Berkshire

## 2018-10-03 NOTE — Progress Notes (Signed)
Labor Progress Note  Mackenzie Maxwell, 28 y.o., G2P1001, with an IUP @ [redacted]w[redacted]d, presented for IOL for GHTN,. Not medicated.   Subjective: Pt resting in bed with husband at bedside, pt endorses feeling cxt, able to pace breath through them but endorses wanting an epidural now. Pt has H/O migraine, and endorses having a frontal lobe dull HA for a week now, endorses getting better with meds, but then HA always comes back, Pt HA now 5/10, declines wanting meds at this time. Pt denies RUQ pain nor, vision changes.  Patient Active Problem List   Diagnosis Date Noted  . Gestational hypertension 10/03/2018  . Pregnancy-induced hypertension 09/29/2018  . Depressive disorder 04/16/2018  . Rubella non-immune status, antepartum 04/02/2018  . 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 Migraine 02/14/2016  . Asthma 10/02/2011   Objective: BP 133/90   Pulse (!) 103   Temp 98.2 F (36.8 C) (Oral)   Resp 18   Ht 5\' 5"  (1.651 m)   Wt 120.9 kg   SpO2 100% Comment: room air   BMI 44.35 kg/m  No intake/output data recorded. No intake/output data recorded. NST: FHR baseline 125 bpm, Variability: moderate, Accelerations:present, Decelerations:  Absent = Cat 1/Reactive CTX:  irregular, every 2-4 minutes, lasting 60 seconds Uterus gravid, soft non tender, moderate to palpate with contractions.  SVE:  Dilation: 3 Effacement (%): 50 Station: -2 Exam by:: Luvenia Starch, CNM Pitocin at 14 mUn/min  Assessment:  Mackenzie Maxwell, 28 y.o., G2P1001, with an IUP @ [redacted]w[redacted]d, presented for IOL for GHTN,. Not medicated. Pt has H/o Obesity BMI 44, asthma, depression and anxiety , no meds, and migraines. Pt feeling now cxt, wants epidural. Progressing on pitocin.  Patient Active Problem List   Diagnosis Date Noted  . Gestational hypertension 10/03/2018  . Pregnancy-induced hypertension 09/29/2018  . Depressive disorder 04/16/2018  . Rubella non-immune status, antepartum 04/02/2018  .  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 Migraine 02/14/2016  . Asthma 10/02/2011   NICHD: Category 1  Membranes:  Intact, no s/s of infection  Induction:    Cytotec x0, cervic started at 3 cm  Foley Bulb: No  Pitocin - 14  Pain management:               IV pain management: x1 @ 0519, 56mcg fentanyl              Epidural placement:  Requesting placement now  GBS Negative  GHTN: BP 133/90, asymptomatic, no meds.   Migraines: 5/10 pain, declined meds, will stay hydrated with monitoring. Fioricet for pain last given at 0001.    Plan: Continue labor plan Continuous monitoring Rest/Peanut ball Plans for epidural now.  Migraines: Continue Fioricet for pain control. Monitor for worsening GHTN: monitor BP, if SR will repeat PreE labs and start magnesium with IV labetalol protocol.  Frequent position changes to facilitate fetal rotation and descent. Will reassess with cervical exam at 1215 or earlier if necessary Continue pitocin per protocol Anticipate labor progression and vaginal delivery.   Md Ozan aware of plan and verbalized agreement.   Noralyn Pick, NP-C, CNM, MSN 10/03/2018. 10:21 AM

## 2018-10-03 NOTE — Transfer of Care (Signed)
Immediate Anesthesia Transfer of Care Note  Patient: Mackenzie Maxwell  Procedure(s) Performed: CESAREAN SECTION (N/A )  Patient Location: PACU  Anesthesia Type:Epidural  Level of Consciousness: awake, alert , oriented and patient cooperative  Airway & Oxygen Therapy: Patient Spontanous Breathing  Post-op Assessment: Report given to RN and Post -op Vital signs reviewed and stable  Post vital signs: Reviewed and stable  Last Vitals:  Vitals Value Taken Time  BP    Temp    Pulse    Resp    SpO2      Last Pain:  Vitals:   10/03/18 1201  TempSrc: Oral  PainSc: 0-No pain      Patients Stated Pain Goal: 2 (45/14/60 4799)  Complications: No apparent anesthesia complications

## 2018-10-03 NOTE — Op Note (Signed)
PreOp Diagnosis:  1) Intrauterine pregnancy @ [redacted]w[redacted]d 2) Gestational HTN 3) Cord prolapse 4) Obesity: BMI 44  PostOp Diagnosis: same Procedure: Primary C-section Surgeon: Dr. Janyth Pupa Assistant: Noralyn Pick, CNM Anesthesia: epidural Complications: none EBL: 431cc UOP: 20cc Fluids: 400cc  Findings: Female infant from vertex presentation, normal uterus, tubes and ovaries  PROCEDURE:  Informed consent was obtained from the patient with risks, benefits, complications, treatment options, and expected outcomes discussed with the patient.  The patient concurred with the proposed plan, giving informed consent with form signed.   The patient was taken to Operating Room, and identified with the procedure verified as C-Section Delivery with Time Out. With induction of anesthesia, the patient was prepped and draped in the usual sterile fashion. A Pfannenstiel incision was made and carried down through the subcutaneous tissue to the fascia. The fascia was incised in the midline and extended transversely. The superior aspect of the fascial incision was grasped with Kochers elevated and the underlying muscle dissected off. The inferior aspect of the facial incision was in similar fashion, grasped elevated and rectus muscles dissected off. The peritoneum was identified and entered. Peritoneal incision was extended longitudinally. Alexis retractor was placed.  The utero-vesical peritoneal reflection was identified and incised transversely with the [facility] scissors, the incision extended laterally, the bladder flap created digitally. A low transverse uterine incision was made and the infants head delivered atraumatically with vacuum assistance. After the umbilical cord was clamped and cut cord blood was obtained for evaluation.   The placenta was removed intact and appeared normal. The uterine outline, tubes and ovaries appeared normal. The uterine incision was closed with running locked sutures of 0 Vicryl and  a second layer of the same stitch was used in an imbricating fashion.  Excellent hemostasis was obtained.  The pericolic gutters were then cleared of all clots and debris. Interceed was placed.  The peritoneum was closed in a running fashion. The fascia was then reapproximated with running sutures of 0 Vicryl. The subcutaneous tissue was reapproximated with 2-0 plain gut suture.  The skin was closed with 4-0 vicryl in a subcuticular fashion.  Instrument, sponge, and needle counts were correct prior the abdominal closure and at the conclusion of the case. The patient was taken to recovery in stable condition.  Janyth Pupa, DO [phone] (cell) [phone] (office)

## 2018-10-03 NOTE — Lactation Note (Signed)
This note was copied from a baby's chart. Lactation Consultation Note  Patient Name: Mackenzie Maxwell Date: 10/03/2018 Reason for consult: Initial assessment;1st time breastfeeding;Early term 18-38.6wks  4 hours old early term female who is being exclusively BF by his mother, she's a P2 but not very experienced BF. She didn't BF her first baby, mom said she never latched. LC reviewed hand expression with her and noticed that her tissue is not very compressible, some edema noted as well. Set her up with breast shells, instructions, cleaning and storage were reviewed. Mom has a DEBP coming in the mail from her insurance. No colostrum noted as this point, but mom reported (+) breast changes during the pregnancy.  Baby asleep in his bassinet when entering the room, offered assistance with latch but mom politely declined, baby already had a feeding. Asked mom to call for assistance when needed. Per mom BF is going well so far and she feels hopeful that it will work out this time. Reviewed normal newborn behavior, feeding cues and cluster feeding.   Feeding plan:  1. Encouraged mom to feed baby STS 8-12 times/24 hours or sooner if feeding cues are present 2. Hand expression and finger/spoon feeding were also encouraged  BF brochure, BF resources and feeding diary were reviewed. Dad present and supportive. Parents reported all questions and concerns were answered, they're both aware of [place] services and will call PRN.  Maternal Data Formula Feeding for Exclusion: No Has patient been taught Hand Expression?: Yes Does the patient have breastfeeding experience prior to this delivery?: No(Didn't BF her first child, mom said she never latched)  Feeding Feeding Type: Breast Fed  LATCH Score Latch: Grasps breast easily, tongue down, lips flanged, rhythmical sucking.  Audible Swallowing: A few with stimulation  Type of Nipple: Flat  Comfort (Breast/Nipple): Soft / non-tender  Hold  (Positioning): Assistance needed to correctly position infant at breast and maintain latch.  LATCH Score: 7  Interventions Interventions: Breast feeding basics reviewed;Shells;Reverse pressure;Breast compression;Hand express;Breast massage  Lactation Tools Discussed/Used Tools: Shells Shell Type: Inverted WIC Program: No   Consult Status Consult Status: Follow-up Date: 10/04/18 Follow-up type: In-patient    [place] 10/03/2018, 5:01 PM

## 2018-10-03 NOTE — Progress Notes (Signed)
Mackenzie Maxwell is a 28 y.o. G2P1001 at [redacted]w[redacted]d admitted for induction of labor due to Hypertension.  Subjective: Reports pressure with contractions but no pain. Cervix is now very anterior.   Objective: Vitals:   10/03/18 0500 10/03/18 0530 10/03/18 0600 10/03/18 0630  BP: 137/84 134/87 133/82 121/83  Pulse: 96 97 100 95  Resp: 16 16 16 16   Temp:      TempSrc:      SpO2:      Weight:      Height:       FHT:  FHR: 130s bpm, variability: moderate,  accelerations:  Present,  decelerations:  Absent UC:   regular, every 3-4 minutes SVE:   Dilation: 3 Effacement (%): 50 Station: -2 Exam by:: Ranee Gosselin, CNM  Labs: Lab Results  Component Value Date   WBC 8.0 10/03/2018   HGB 10.0 (L) 10/03/2018   HCT 29.2 (L) 10/03/2018   MCV 73.9 (L) 10/03/2018   PLT 271 10/03/2018    Assessment / Plan: Induction of labor due to gestational hypertension,  progressing well on pitocin  Labor: Progressing on Pitocin, will continue to increase then AROM Preeclampsia:  no signs or symptoms of toxicity, intake and ouput balanced and labs stable Fetal Wellbeing:  Category I Pain Control:  Labor support without medications I/D:  n/a Anticipated MOD:  NSVD  Mackenzie Maxwell 10/03/2018, 7:00 AM

## 2018-10-04 LAB — CBC
HCT: 25.6 % — ABNORMAL LOW (ref 36.0–46.0)
Hemoglobin: 8.6 g/dL — ABNORMAL LOW (ref 12.0–15.0)
MCH: 25 pg — ABNORMAL LOW (ref 26.0–34.0)
MCHC: 33.6 g/dL (ref 30.0–36.0)
MCV: 74.4 fL — ABNORMAL LOW (ref 80.0–100.0)
Platelets: 235 10*3/uL (ref 150–400)
RBC: 3.44 MIL/uL — ABNORMAL LOW (ref 3.87–5.11)
RDW: 16.1 % — ABNORMAL HIGH (ref 11.5–15.5)
WBC: 9.4 10*3/uL (ref 4.0–10.5)
nRBC: 0 % (ref 0.0–0.2)

## 2018-10-04 MED ORDER — FERROUS SULFATE 325 (65 FE) MG PO TABS
325.0000 mg | ORAL_TABLET | Freq: Two times a day (BID) | ORAL | Status: DC
  Administered 2018-10-04 – 2018-10-05 (×3): 325 mg via ORAL
  Filled 2018-10-04 (×3): qty 1

## 2018-10-04 NOTE — Progress Notes (Signed)
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  CSW met with Mackenzie Maxwell to offer support and complete assessment.    Mackenzie Maxwell resting in bed with infant asleep in basinet, when CSW entered the room. CSW introduced self and explained reason for consult to which Mackenzie Maxwell expressed understanding. Mackenzie Maxwell very pleasant, easy to engage and insightful. 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 Mackenzie Maxwell described symptoms such as experiencing extreme emotions, crying for no reason, being sad and having increased anxiety. Mackenzie Maxwell reported symptoms lasted for about a year and stated she was placed on medications. Mackenzie Maxwell stated symptoms eventually got better and reported having good support. Mackenzie Maxwell attributes much of her PPD to situational stressors going on at the time. Mackenzie Maxwell stated she has been prescribed Zoloft to help with symptoms but is not sure if she will take it yet as she feels she is in a better place. CSW provided education regarding the baby blues period vs. perinatal mood disorders, discussed treatment and gave resources for mental health follow up if concerns arise.  CSW recommends self-evaluation during the postpartum time period using the New Mom Checklist from Postpartum Progress and encouraged Mackenzie Maxwell to contact a medical professional if symptoms are noted at any time. Mackenzie Maxwell denied any current mental health symptoms and denied any current SI, HI or DV. Mackenzie Maxwell reported having a good support system consisting of FOB, her sister, her mom and her friends.  Mackenzie Maxwell confirmed having all essential items for infant once discharged. Mackenzie Maxwell reported infant would be sleeping in a basinet once home. CSW provided review of Sudden Infant Death Syndrome (SIDS) precautions and safe sleeping habits.    CSW identifies no further need for intervention and no barriers to discharge at this time.  Mackenzie Maxwell, LCSWA  [facility] [phone] 

## 2018-10-04 NOTE — Progress Notes (Signed)
Patient ID: Mackenzie Maxwell, female   DOB: [birth year], 28 y.o.   MRN: [id number]  PT denies SOB or chest pain BP 120/79 (BP Location: Left Arm)   Pulse (!) 109   Temp 98.8 F (37.1 C) (Oral)   Resp 18   Ht 5\' 5"  (1.651 m)   Wt 120.9 kg   SpO2 97%   BMI 44.35 kg/m   Ext 2 plus edema.  NT neg homans sign B Pt reassured.  Continue current care

## 2018-10-04 NOTE — Lactation Note (Signed)
This note was copied from a baby's chart. Lactation Consultation Note  Patient Name: Mackenzie Maxwell Date: 10/04/2018 Reason for consult: Follow-up assessment Baby is 25 hours old/3% weight loss.  Mom reports that baby is latching easily but still hungry after feeds.  She has started supplementing with formula.  Discussed milk coming to volume and the importance of putting baby to breast with cues.  Instructed to call for latch assist prn.  Maternal Data    Feeding    LATCH Score                   Interventions    Lactation Tools Discussed/Used     Consult Status Consult Status: Follow-up Date: 10/05/18 Follow-up type: In-patient    Ave Filter 10/04/2018, 1:56 PM

## 2018-10-04 NOTE — Progress Notes (Signed)
Mackenzie Maxwell [id number] Postpartum Postoperative Day # 1  Mackenzie Maxwell, G2P1001, [redacted]w[redacted]d, S/P Primary LT Cesarean Section due to prolapsed cord  HPI: Mackenzie Maxwell, 28 y.o., G2P1001, with an IUP @ [redacted]w[redacted]d, presented for IOL for [facility],. Not medicated. Pt has H/o Obesity BMI 44, asthma, depression and anxiety , no meds, and migraines.   Subjective: Patient up ad lib, denies syncope or dizziness. Reports consuming regular diet without issues and denies N/V. Patient reports 0 bowel movement + passing flatus.  Denies issues with urination, foley still in place to come out today. and reports bleeding is "lighter."  Patient is breastfeeding and reports going well.  Desires unknown for postpartum contraception.  Pain is being appropriately managed with use of IV meds. Baby female-desires in pt circ. Denies HA, RUQ pain nor vision changes.   Objective: Patient Vitals for the past 24 hrs:  BP Temp Temp src Pulse Resp SpO2  10/03/18 2300 123/74 98.3 F (36.8 C) Oral 75 16 99 %  10/03/18 1926 129/70 98.7 F (37.1 C) Oral 81 16 100 %  10/03/18 1830 127/69 97.7 F (36.5 C) - 77 16 98 %  10/03/18 1717 128/84 98.1 F (36.7 C) - 81 16 100 %  10/03/18 1558 138/82 98 F (36.7 C) Oral 72 16 -  10/03/18 1536 (!) 145/81 - - 80 19 99 %  10/03/18 1530 (!) 149/73 - - 80 14 100 %  10/03/18 1500 (!) 142/72 - - 76 13 99 %  10/03/18 1445 (!) 124/58 - - (!) 110 20 99 %  10/03/18 1430 (!) 113/50 - - 84 17 100 %  10/03/18 1415 107/64 - - 80 15 100 %  10/03/18 1400 (!) 109/59 (!) 97.5 F (36.4 C) - 76 12 99 %  10/03/18 1345 106/61 - - 79 15 -  10/03/18 1339 (!) 95/55 (!) 96.9 F (36.1 C) - 77 - -  10/03/18 1231 124/85 - - 98 16 -  10/03/18 1201 116/83 98.4 F (36.9 C) Oral (!) 103 18 -  10/03/18 1131 119/81 - - (!) 106 18 -  10/03/18 1101 129/81 - - (!) 105 18 -  10/03/18 1031 122/78 - - 84 18 -  10/03/18 1001 133/90 - - (!) 103 18 -  10/03/18 0931 129/86 - - 96 18 -  10/03/18 0916 128/87 - - (!) 118 18 -  10/03/18  0911 132/87 - - (!) 104 18 -  10/03/18 0906 125/78 - - 87 18 -  10/03/18 0901 119/79 - - (!) 103 18 -  10/03/18 0856 115/71 - - (!) 109 - -  10/03/18 0853 123/78 - - 91 18 -  10/03/18 0845 136/85 - - 91 18 -  10/03/18 0833 127/72 - - 94 18 -  10/03/18 0831 (!) 135/105 - - 82 18 -  10/03/18 0801 132/82 - - 85 18 -  10/03/18 0731 128/77 98.2 F (36.8 C) Oral 82 18 -  10/03/18 0701 134/78 - - 86 16 -  10/03/18 0630 121/83 - - 95 16 -  10/03/18 0600 133/82 - - 100 16 -  10/03/18 0530 134/87 - - 97 16 -  10/03/18 0500 137/84 - - 96 16 -  10/03/18 0430 134/83 - - 92 16 -  10/03/18 0401 (!) 129/96 - - 90 16 -  10/03/18 0338 (!) 116/93 - - 88 16 -  10/03/18 0228 128/80 98.4 F (36.9 C) Oral 91 16 -  10/03/18 0216 129/77 - - 76 - -  10/03/18  0201 104/63 - - 85 - -  10/03/18 0145 (!) 143/89 - - 87 - -  10/03/18 0130 139/90 98.8 F (37.1 C) Oral 92 20 100 %     Physical Exam:  General: alert, cooperative, appears stated age and no distress Mood/Affect: happy Lungs: clear to auscultation, no wheezes, rales or rhonchi, symmetric air entry.  Heart: normal rate, regular rhythm, normal S1, S2, no murmurs, rubs, clicks or gallops. Breast: breasts appear normal, no suspicious masses, no skin or nipple changes or axillary nodes. Abdomen:  + bowel sounds, soft, non-tender Incision: healing well, no significant drainage, no dehiscence, no significant erythema, Honeycomb dressing  Uterine Fundus: firm, involution -1 Lochia: appropriate Skin: Warm, Dry. DVT Evaluation: No evidence of DVT seen on physical exam. Negative Homan's sign. No cords or calf tenderness. No significant calf/ankle edema. No clonus, 2+ Patellar DTR.   Labs: Recent Labs    10/03/18 0009 10/03/18 0745  HGB 10.0* 10.1*  HCT 29.2* 29.8*  WBC 8.0 7.7    CBG (last 3)  No results for input(s): GLUCAP in the last 72 hours.   I/O: I/O last 3 completed shifts: In: 525 [I.V.:525] Out: 651 [Urine:170; Blood:481]    Assessment Postpartum Postoperative Day # 1. Mackenzie Maxwell, G2P1001, [redacted]w[redacted]d, Was IOL for [facility], with neg preeclampsia labs is now  S/P primary LT Cesarean Section due to prolapsed cord. Obesity BMI 44.  Pt stable. -1 Involution. breastFeeding. Hemodynamically Stable.  Plan: Continue other mgmt as ordered CBC pending for morning  VTE Prophylactics: SCD, ambulated as tolerates. Lovenox @ 24 hour for BMi of 44.  Pain control: Motrin/Tylenol/Narcotics PRN Education given regarding options for contraception, including barrier methods, injectable contraception, IUD placement, oral contraceptives.  Breastfeeding, Lactation consult and Circumcision prior to discharge  Dr. Charlesetta Garibaldi to be updated on patient [place] NP-C, CNM 10/04/2018, 1:20 AM

## 2018-10-05 ENCOUNTER — Encounter (HOSPITAL_COMMUNITY): Payer: Self-pay | Admitting: *Deleted

## 2018-10-05 MED ORDER — ACETAMINOPHEN 500 MG PO TABS: 1000.0000 mg | ORAL_TABLET | Freq: Four times a day (QID) | ORAL | 0 refills | Status: AC | PRN

## 2018-10-05 MED ORDER — MEASLES, MUMPS & RUBELLA VAC IJ SOLR
0.5000 mL | Freq: Once | INTRAMUSCULAR | Status: AC
  Administered 2018-10-05: 0.5 mL via SUBCUTANEOUS
  Filled 2018-10-05: qty 0.5

## 2018-10-05 MED ORDER — FERROUS SULFATE 325 (65 FE) MG PO TABS: 325.0000 mg | ORAL_TABLET | Freq: Every day | ORAL | 2 refills | Status: DC

## 2018-10-05 MED ORDER — PRENATAL MULTIVITAMIN CH: 1.0000 | ORAL_TABLET | Freq: Every day | ORAL | 2 refills | Status: DC

## 2018-10-05 MED ORDER — OXYCODONE HCL 5 MG PO TABS: 5.0000 mg | ORAL_TABLET | Freq: Four times a day (QID) | ORAL | 0 refills | Status: AC | PRN

## 2018-10-05 NOTE — Lactation Note (Signed)
This note was copied from a baby's chart. Lactation Consultation Note  Patient Name: Mackenzie Maxwell IRWER'X Date: 10/05/2018   Mom says she is latching infant for 30-45 minutes before offering a bottle. The Enfamil slow-flow nipple is noted in room & parents do report that he drinks fast & they sometimes see formula come out of the sides of his mouth. I gave them the Similac slower-flow nipple instead & told them to tell their nurse if that seemed too fast.  Hand expression was taught to Mom with good results. Mom knows how to use the manual pump that came with DEBP kit. She reported that she has another pump on its way.   Parents deny having any more questions.   Mackenzie Maxwell [facility] 10/05/2018, 10:35 AM

## 2018-10-05 NOTE — Discharge Summary (Signed)
OB Discharge Summary     Patient Name: Mackenzie Maxwell DOB: 1991-04-23 MRN: [id number]  Date of admission: 10/02/2018 Delivering MD: Janyth Pupa   Date of discharge: 10/05/2018  Admitting diagnosis: 37 wks headache and bp is high  Intrauterine pregnancy: [redacted]w[redacted]d     Secondary diagnosis:  Principal Problem:   Pregnancy-induced hypertension Active Problems:   Gestational hypertension  Discharge diagnosis: Term Pregnancy Delivered                                                                                                Post partum procedures:n/a  Augmentation: AROM and Pitocin  Complications: None  Hospital course:  Induction of Labor With Cesarean Section  28 y.o. yo G2P1001 at [redacted]w[redacted]d was admitted to the hospital 10/02/2018 for induction of labor. Patient had a labor course significant for gestational hypertension. The patient went for cesarean section due to Cord Prolapse, and delivered a Viable infant,10/03/2018  Membrane Rupture Time/Date: 12:27 PM ,10/03/2018   Details of operation can be found in separate operative Note.  Patient had an uncomplicated postpartum course. She is ambulating, tolerating a regular diet, passing flatus, and urinating well.  Patient is discharged home in stable condition on 10/05/18.                                    Physical exam  Vitals:   10/04/18 1226 10/04/18 1445 10/04/18 2342 10/05/18 0500  BP: 120/79 126/67 133/77 (!) 112/53  Pulse: (!) 109 (!) 109 (!) 113 94  Resp: 18 18 18 18   Temp: 98.8 F (37.1 C) 98.7 F (37.1 C) 99 F (37.2 C) 98.3 F (36.8 C)  TempSrc: Oral Oral Oral Oral  SpO2: 97%     Weight:      Height:       General: alert, cooperative and no distress Lochia: appropriate Uterine Fundus: firm Incision: Healing well with no significant drainage, No significant erythema, Dressing is clean, dry, and intact DVT Evaluation: No evidence of DVT seen on physical exam. Negative Homan's sign. No cords or calf tenderness. No  significant calf/ankle edema. Gestational hypertension: Patient normotensive and denies symptoms of preeclampsia. Per Dr. Alesia Richards patient is stable for discharge. Patient to take her blood pressure daily and call if elevated >= 150/100 and present to MAU if >=160/110. Patient to follow up in the office in 1 week for incision and blood pressure check. Patient verbalizes she will call or present to MAU with symptoms of preeclampsia.   Labs: Lab Results  Component Value Date   WBC 9.4 10/04/2018   HGB 8.6 (L) 10/04/2018   HCT 25.6 (L) 10/04/2018   MCV 74.4 (L) 10/04/2018   PLT 235 10/04/2018   CMP Latest Ref Rng & Units 10/03/2018  Glucose 70 - 99 mg/dL 103(H)  BUN 6 - 20 mg/dL 6  Creatinine 0.44 - 1.00 mg/dL 0.50  Sodium 135 - 145 mmol/L 136  Potassium 3.5 - 5.1 mmol/L 3.5  Chloride 98 - 111 mmol/L 108  CO2 22 - 32 mmol/L  23  Calcium 8.9 - 10.3 mg/dL 8.7(L)  Total Protein 6.5 - 8.1 g/dL 5.5(L)  Total Bilirubin 0.3 - 1.2 mg/dL 0.4  Alkaline Phos 38 - 126 U/L 68  AST 15 - 41 U/L 15  ALT 0 - 44 U/L 13    Discharge instruction: per After Visit Summary and "Baby and Me Booklet". Preeclampsia precautions discussed and handout given.   After visit meds:  Allergies as of 10/05/2018      Reactions   Ibuprofen    Child allergy. Has taken as an adult with no problems.      Medication List    TAKE these medications   acetaminophen 500 MG tablet Commonly known as:  TYLENOL Take 2 tablets (1,000 mg total) by mouth every 6 (six) hours as needed for moderate pain. Do not take greater than 4,000mg  in 24 hours   ferrous sulfate 325 (65 FE) MG tablet Take 1 tablet (325 mg total) by mouth daily with breakfast.   oxyCODONE 5 MG immediate release tablet Commonly known as:  Oxy IR/ROXICODONE Take 1 tablet (5 mg total) by mouth every 6 (six) hours as needed for up to 7 days for severe pain.   prenatal multivitamin Tabs tablet Take 1 tablet by mouth daily at 12 noon.       Diet: routine  diet  Activity: Advance as tolerated. Pelvic rest for 6 weeks.   Outpatient follow up:1 week for blood pressure and incision check, 6 weeks for postpartum appointment Follow up Appt:No future appointments. Follow up Visit:No follow-ups on file.  Postpartum contraception: Undecided  Newborn Data: Live born female  Birth Weight: 7 lb 3 oz (3260 g) APGAR: 72, 9  Newborn Delivery   Birth date/time:  10/03/2018 12:55:00 Delivery type:  C-Section, Low Transverse Trial of labor:  Yes C-section categorization:  Primary     Baby Feeding: Bottle and Breast Disposition:home with mother   10/05/2018 [name], CNM

## 2018-10-05 NOTE — Discharge Instructions (Signed)
Postpartum Care After Vaginal Delivery  The period of time right after you deliver your newborn is called the postpartum period. What kind of medical care will I receive?  You may continue to receive fluids and medicines through an IV tube inserted into one of your veins.  If an incision was made near your vagina (episiotomy) or if you had some vaginal tearing during delivery, cold compresses may be placed on your episiotomy or your tear. This helps to reduce pain and swelling.  You may be given a squirt bottle to use when you go to the bathroom. You may use this until you are comfortable wiping as usual. To use the squirt bottle, follow these steps: ? Before you urinate, fill the squirt bottle with warm water. Do not use hot water. ? After you urinate, while you are sitting on the toilet, use the squirt bottle to rinse the area around your urethra and vaginal opening. This rinses away any urine and blood. ? You may do this instead of wiping. As you start healing, you may use the squirt bottle before wiping yourself. Make sure to wipe gently. ? Fill the squirt bottle with clean water every time you use the bathroom.  You will be given sanitary pads to wear. How can I expect to feel?  You may not feel the need to urinate for several hours after delivery.  You will have some soreness and pain in your abdomen and vagina.  If you are breastfeeding, you may have uterine contractions every time you breastfeed for up to several weeks postpartum. Uterine contractions help your uterus return to its normal size.  It is normal to have vaginal bleeding (lochia) after delivery. The amount and appearance of lochia is often similar to a menstrual period in the first week after delivery. It will gradually decrease over the next few weeks to a dry, yellow-brown discharge. For most women, lochia stops completely by 6-8 weeks after delivery. Vaginal bleeding can vary from woman to woman.  Within the first few  days after delivery, you may have breast engorgement. This is when your breasts feel heavy, full, and uncomfortable. Your breasts may also throb and feel hard, tightly stretched, warm, and tender. After this occurs, you may have milk leaking from your breasts. Your health care provider can help you relieve discomfort due to breast engorgement. Breast engorgement should go away within a few days.  You may feel more sad or worried than normal due to hormonal changes after delivery. These feelings should not last more than a few days. If these feelings do not go away after several days, speak with your health care provider. How should I care for myself?  Tell your health care provider if you have pain or discomfort.  Drink enough water to keep your urine clear or pale yellow.  Wash your hands thoroughly with soap and water for at least 20 seconds after changing your sanitary pads, after using the toilet, and before holding or feeding your baby.  If you are not breastfeeding, avoid touching your breasts a lot. Doing this can make your breasts produce more milk.  If you become weak or lightheaded, or you feel like you might faint, ask for help before: ? Getting out of bed. ? Showering.  Change your sanitary pads frequently. Watch for any changes in your flow, such as a sudden increase in volume, a change in color, the passing of large blood clots. If you pass a blood clot from your vagina,  save it to show to your health care provider. Do not flush blood clots down the toilet without having your health care provider look at them.  Make sure that all your vaccinations are up to date. This can help protect you and your baby from getting certain diseases. You may need to have immunizations done before you leave the hospital.  If desired, talk with your health care provider about methods of family planning or birth control (contraception). How can I start bonding with my baby? Spending as much time as  possible with your baby is very important. During this time, you and your baby can get to know each other and develop a bond. Having your baby stay with you in your room (rooming in) can give you time to get to know your baby. Rooming in can also help you become comfortable caring for your baby. Breastfeeding can also help you bond with your baby. How can I plan for returning home with my baby?  Make sure that you have a car seat installed in your vehicle. ? Your car seat should be checked by a certified car seat installer to make sure that it is installed safely. ? Make sure that your baby fits into the car seat safely.  Ask your health care provider any questions you have about caring for yourself or your baby. Make sure that you are able to contact your health care provider with any questions after leaving the hospital. This information is not intended to replace advice given to you by your health care provider. Make sure you discuss any questions you have with your health care provider. Document Released: 03/02/2007 Document Revised: 10/08/2015 Document Reviewed: 04/09/2015 Elsevier Interactive Patient Education  2018 [employer].   Postpartum Depression and Baby Blues The postpartum period begins right after the birth of a baby. During this time, there is often a great amount of joy and excitement. It is also a time of many changes in the life of the parents. Regardless of how many times a mother gives birth, each child brings new challenges and dynamics to the family. It is not unusual to have feelings of excitement along with confusing shifts in moods, emotions, and thoughts. All mothers are at risk of developing postpartum depression or the "baby blues." These mood changes can occur right after giving birth, or they may occur many months after giving birth. The baby blues or postpartum depression can be mild or severe. Additionally, postpartum depression can go away rather quickly, or it can  be a long-term condition. What are the causes? Raised hormone levels and the rapid drop in those levels are thought to be a main cause of postpartum depression and the baby blues. A number of hormones change during and after pregnancy. Estrogen and progesterone usually decrease right after the delivery of your baby. The levels of thyroid hormone and various cortisol steroids also rapidly drop. Other factors that play a role in these mood changes include major life events and genetics. What increases the risk? If you have any of the following risks for the baby blues or postpartum depression, know what symptoms to watch out for during the postpartum period. Risk factors that may increase the likelihood of getting the baby blues or postpartum depression include:  Having a personal or family history of depression.  Having depression while being pregnant.  Having premenstrual mood issues or mood issues related to oral contraceptives.  Having a lot of life stress.  Having marital conflict.  Lacking  a social support network.  Having a baby with special needs.  Having health problems, such as diabetes.  What are the signs or symptoms? Symptoms of baby blues include:  Brief changes in mood, such as going from extreme happiness to sadness.  Decreased concentration.  Difficulty sleeping.  Crying spells, tearfulness.  Irritability.  Anxiety.  Symptoms of postpartum depression typically begin within the first month after giving birth. These symptoms include:  Difficulty sleeping or excessive sleepiness.  Marked weight loss.  Agitation.  Feelings of worthlessness.  Lack of interest in activity or food.  Postpartum psychosis is a very serious condition and can be dangerous. Fortunately, it is rare. Displaying any of the following symptoms is cause for immediate medical attention. Symptoms of postpartum psychosis include:  Hallucinations and delusions.  Bizarre or disorganized  behavior.  Confusion or disorientation.  How is this diagnosed? A diagnosis is made by an evaluation of your symptoms. There are no medical or lab tests that lead to a diagnosis, but there are various questionnaires that a health care provider may use to identify those with the baby blues, postpartum depression, or psychosis. Often, a screening tool called the [place] Postnatal Depression Scale is used to diagnose depression in the postpartum period. How is this treated? The baby blues usually goes away on its own in 1-2 weeks. Social support is often all that is needed. You will be encouraged to get adequate sleep and rest. Occasionally, you may be given medicines to help you sleep. Postpartum depression requires treatment because it can last several months or longer if it is not treated. Treatment may include individual or group therapy, medicine, or both to address any social, physiological, and psychological factors that may play a role in the depression. Regular exercise, a healthy diet, rest, and social support may also be strongly recommended. Postpartum psychosis is more serious and needs treatment right away. Hospitalization is often needed. Follow these instructions at home:  Get as much rest as you can. Nap when the baby sleeps.  Exercise regularly. Some women find yoga and walking to be beneficial.  Eat a balanced and nourishing diet.  Do little things that you enjoy. Have a cup of tea, take a bubble bath, read your favorite magazine, or listen to your favorite music.  Avoid alcohol.  Ask for help with household chores, cooking, grocery shopping, or running errands as needed. Do not try to do everything.  Talk to people close to you about how you are feeling. Get support from your partner, family members, friends, or other new moms.  Try to stay positive in how you think. Think about the things you are grateful for.  Do not spend a lot of time alone.  Only take  over-the-counter or prescription medicine as directed by your health care provider.  Keep all your postpartum appointments.  Let your health care provider know if you have any concerns. Contact a health care provider if: You are having a reaction to or problems with your medicine. Get help right away if:  You have suicidal feelings.  You think you may harm the baby or someone else. This information is not intended to replace advice given to you by your health care provider. Make sure you discuss any questions you have with your health care provider. Document Released: 02/07/2004 Document Revised: 10/11/2015 Document Reviewed: 02/14/2013 Elsevier Interactive Patient Education  2017 [employer]   Preeclampsia and Eclampsia  Preeclampsia is a serious condition that may develop during pregnancy. It  is also called toxemia of pregnancy. This condition causes high blood pressure along with other symptoms, such as swelling and headaches. These symptoms may develop as the condition gets worse. Preeclampsia may occur at 20 weeks of pregnancy or later. Diagnosing and treating preeclampsia early is very important. If not treated early, it can cause serious problems for you and your baby. One problem it can lead to is eclampsia. Eclampsia is a condition that causes muscle jerking or shaking (convulsions or seizures) and other serious problems for the mother. During pregnancy, delivering your baby may be the best treatment for preeclampsia or eclampsia. For most women, preeclampsia and eclampsia symptoms go away after giving birth. In rare cases, a woman may develop preeclampsia after giving birth (postpartum preeclampsia). This usually occurs within 48 hours after childbirth but may occur up to 6 weeks after giving birth. What are the causes? The cause of preeclampsia is not known. What increases the risk? The following risk factors make you more likely to develop preeclampsia:  Being pregnant for  the first time.  Having had preeclampsia during a past pregnancy.  Having a family history of preeclampsia.  Having high blood pressure.  Being pregnant with more than one baby.  Being 67 or older.  Being African-American.  Having kidney disease or diabetes.  Having medical conditions such as lupus or blood diseases.  Being very overweight (obese). What are the signs or symptoms? The earliest signs of preeclampsia are:  High blood pressure.  Increased protein in your urine. Your health care provider will check for this at every visit before you give birth (prenatal visit). Other symptoms that may develop as the condition gets worse include:  Severe headaches.  Sudden weight gain.  Swelling of the hands, face, legs, and feet.  Nausea and vomiting.  Vision problems, such as blurred or double vision.  Numbness in the face, arms, legs, and feet.  Urinating less than usual.  Dizziness.  Slurred speech.  Abdominal pain, especially upper abdominal pain.  Convulsions or seizures. How is this diagnosed? There are no screening tests for preeclampsia. Your health care provider will ask you about symptoms and check for signs of preeclampsia during your prenatal visits. You may also have tests that include:  Urine tests.  Blood tests.  Checking your blood pressure.  Monitoring your babys heart rate.  Ultrasound. How is this treated? You and your health care provider will determine the treatment approach that is best for you. Treatment may include:  Having more frequent prenatal exams to check for signs of preeclampsia, if you have an increased risk for preeclampsia.  Medicine to lower your blood pressure.  Staying in the hospital, if your condition is severe. There, treatment will focus on controlling your blood pressure and the amount of fluids in your body (fluid retention).  Taking medicine (magnesium sulfate) to prevent seizures. This may be given as an  injection or through an IV.  Taking a low-dose aspirin during your pregnancy.  Delivering your baby early, if your condition gets worse. You may have your labor started with medicine (induced), or you may have a cesarean delivery. Follow these instructions at home: Eating and drinking   Drink enough fluid to keep your urine pale yellow.  Avoid caffeine. Lifestyle  Do not use any products that contain nicotine or tobacco, such as cigarettes and e-cigarettes. If you need help quitting, ask your health care provider.  Do not use alcohol or drugs.  Avoid stress as much as possible. Rest  and get plenty of sleep. General instructions  Take over-the-counter and prescription medicines only as told by your health care provider.  When lying down, lie on your left side. This keeps pressure off your major blood vessels.  When sitting or lying down, raise (elevate) your feet. Try putting some pillows underneath your lower legs.  Exercise regularly. Ask your health care provider what kinds of exercise are best for you.  Keep all follow-up and prenatal visits as told by your health care provider. This is important. How is this prevented? There is no known way of preventing preeclampsia or eclampsia from developing. However, to lower your risk of complications and detect problems early:  Get regular prenatal care. Your health care provider may be able to diagnose and treat the condition early.  Maintain a healthy weight. Ask your health care provider for help managing weight gain during pregnancy.  Work with your health care provider to manage any long-term (chronic) health conditions you have, such as diabetes or kidney problems.  You may have tests of your blood pressure and kidney function after giving birth.  Your health care provider may have you take low-dose aspirin during your next pregnancy. Contact a health care provider if:  You have symptoms that your health care provider told  you may require more treatment or monitoring, such as: ? Headaches. ? Nausea or vomiting. ? Abdominal pain. ? Dizziness. ? Light-headedness. Get help right away if:  You have severe: ? Abdominal pain. ? Headaches that do not get better. ? Dizziness. ? Vision problems. ? Confusion. ? Nausea or vomiting.  You have any of the following: ? A seizure. ? Sudden, rapid weight gain. ? Sudden swelling in your hands, ankles, or face. ? Trouble moving any part of your body. ? Numbness in any part of your body. ? Trouble speaking. ? Abnormal bleeding.  You faint. Summary  Preeclampsia is a serious condition that may develop during pregnancy. It is also called toxemia of pregnancy.  This condition causes high blood pressure along with other symptoms, such as swelling and headaches.  Diagnosing and treating preeclampsia early is very important. If not treated early, it can cause serious problems for you and your baby.  Get help right away if you have symptoms that your health care provider told you to watch for. This information is not intended to replace advice given to you by your health care provider. Make sure you discuss any questions you have with your health care provider. Document Released: 05/02/2000 Document Revised: 04/21/2017 Document Reviewed: 12/10/2015 Elsevier Interactive Patient Education  2019 [employer].

## 2018-12-10 ENCOUNTER — Other Ambulatory Visit: Payer: Self-pay

## 2018-12-10 ENCOUNTER — Emergency Department (HOSPITAL_COMMUNITY)
Admission: EM | Admit: 2018-12-10 | Discharge: 2018-12-11 | Disposition: A | Discharge status: Home / Self Care | Payer: No Typology Code available for payment source | Attending: Emergency Medicine | Admitting: Emergency Medicine

## 2018-12-10 DIAGNOSIS — N730 Acute parametritis and pelvic cellulitis: Secondary | ICD-10-CM | POA: Diagnosis not present

## 2018-12-10 DIAGNOSIS — Z87891 Personal history of nicotine dependence: Secondary | ICD-10-CM | POA: Insufficient documentation

## 2018-12-10 DIAGNOSIS — R5383 Other fatigue: Secondary | ICD-10-CM | POA: Insufficient documentation

## 2018-12-10 DIAGNOSIS — L03818 Cellulitis of other sites: Secondary | ICD-10-CM

## 2018-12-10 DIAGNOSIS — J45909 Unspecified asthma, uncomplicated: Secondary | ICD-10-CM | POA: Diagnosis not present

## 2018-12-10 DIAGNOSIS — L089 Local infection of the skin and subcutaneous tissue, unspecified: Secondary | ICD-10-CM | POA: Diagnosis present

## 2018-12-10 DIAGNOSIS — Z79899 Other long term (current) drug therapy: Secondary | ICD-10-CM | POA: Insufficient documentation

## 2018-12-10 LAB — CBC WITH DIFFERENTIAL/PLATELET
Abs Immature Granulocytes: 0.03 10*3/uL (ref 0.00–0.07)
Basophils Absolute: 0 10*3/uL (ref 0.0–0.1)
Basophils Relative: 0 %
Eosinophils Absolute: 0.1 10*3/uL (ref 0.0–0.5)
Eosinophils Relative: 1 %
HCT: 36.5 % (ref 36.0–46.0)
Hemoglobin: 12.5 g/dL (ref 12.0–15.0)
Immature Granulocytes: 0 %
Lymphocytes Relative: 18 %
Lymphs Abs: 2 10*3/uL (ref 0.7–4.0)
MCH: 26.6 pg (ref 26.0–34.0)
MCHC: 34.2 g/dL (ref 30.0–36.0)
MCV: 77.7 fL — ABNORMAL LOW (ref 80.0–100.0)
Monocytes Absolute: 0.9 10*3/uL (ref 0.1–1.0)
Monocytes Relative: 8 %
Neutro Abs: 8.2 10*3/uL — ABNORMAL HIGH (ref 1.7–7.7)
Neutrophils Relative %: 73 %
Platelets: 332 10*3/uL (ref 150–400)
RBC: 4.7 MIL/uL (ref 3.87–5.11)
RDW: 17 % — ABNORMAL HIGH (ref 11.5–15.5)
WBC: 11.2 10*3/uL — ABNORMAL HIGH (ref 4.0–10.5)
nRBC: 0 % (ref 0.0–0.2)

## 2018-12-10 LAB — URINALYSIS, ROUTINE W REFLEX MICROSCOPIC
Bilirubin Urine: NEGATIVE
Glucose, UA: NEGATIVE mg/dL
Ketones, ur: NEGATIVE mg/dL
Nitrite: NEGATIVE
Protein, ur: NEGATIVE mg/dL
Specific Gravity, Urine: 1.012 (ref 1.005–1.030)
pH: 8 (ref 5.0–8.0)

## 2018-12-10 LAB — LACTIC ACID, PLASMA: Lactic Acid, Venous: 1.1 mmol/L (ref 0.5–1.9)

## 2018-12-10 NOTE — ED Triage Notes (Signed)
PER pt she had a c-section on May 17th and has been dealing with infection in her in incision and has been taking antibiotics. 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 Pt is in oral antibiotics now 800mg  [id number]

## 2018-12-11 ENCOUNTER — Emergency Department (HOSPITAL_COMMUNITY): Payer: No Typology Code available for payment source

## 2018-12-11 LAB — POCT I-STAT EG7
Acid-base deficit: 5 mmol/L — ABNORMAL HIGH (ref 0.0–2.0)
Bicarbonate: 20.2 mmol/L (ref 20.0–28.0)
Calcium, Ion: 1.16 mmol/L (ref 1.15–1.40)
HCT: 35 % — ABNORMAL LOW (ref 36.0–46.0)
Hemoglobin: 11.9 g/dL — ABNORMAL LOW (ref 12.0–15.0)
O2 Saturation: 99 %
Patient temperature: 99.6
Potassium: 3.6 mmol/L (ref 3.5–5.1)
Sodium: 138 mmol/L (ref 135–145)
TCO2: 21 mmol/L — ABNORMAL LOW (ref 22–32)
pCO2, Ven: 37.7 mmHg — ABNORMAL LOW (ref 44.0–60.0)
pH, Ven: 7.339 (ref 7.250–7.430)
pO2, Ven: 130 mmHg — ABNORMAL HIGH (ref 32.0–45.0)

## 2018-12-11 LAB — I-STAT CREATININE, ED: Creatinine, Ser: 0.7 mg/dL (ref 0.44–1.00)

## 2018-12-11 LAB — LACTIC ACID, PLASMA: Lactic Acid, Venous: 1.2 mmol/L (ref 0.5–1.9)

## 2018-12-11 LAB — I-STAT BETA HCG BLOOD, ED (MC, WL, AP ONLY): I-stat hCG, quantitative: <5 m[IU]/mL (ref <5)

## 2018-12-11 MED ORDER — FENTANYL CITRATE (PF) 100 MCG/2ML IJ SOLN
50.0000 ug | Freq: Once | INTRAMUSCULAR | Status: DC
  Filled 2018-12-11: qty 2

## 2018-12-11 MED ORDER — SODIUM CHLORIDE 0.9 % IV BOLUS
1000.0000 mL | Freq: Once | INTRAVENOUS | Status: AC
  Administered 2018-12-11: 1000 mL via INTRAVENOUS

## 2018-12-11 MED ORDER — ACETAMINOPHEN 500 MG PO TABS
1000.0000 mg | ORAL_TABLET | Freq: Once | ORAL | Status: AC
  Administered 2018-12-11: 1000 mg via ORAL
  Filled 2018-12-11: qty 2

## 2018-12-11 MED ORDER — IOHEXOL 300 MG/ML  SOLN
100.0000 mL | Freq: Once | INTRAMUSCULAR | Status: AC | PRN
  Administered 2018-12-11: 03:00:00 100 mL via INTRAVENOUS

## 2018-12-11 NOTE — Discharge Instructions (Signed)
Continue bactrim that you were prescribed earlier yesterday. You will be contacted if blood cultures are abnormal. Follow-up with OB-GYN for re-check. Return here for any new/acute changes.

## 2018-12-11 NOTE — ED Notes (Signed)
Returned from CT.

## 2018-12-11 NOTE — ED Notes (Signed)
Patient transported to CT 

## 2018-12-11 NOTE — ED Provider Notes (Signed)
[facility] EMERGENCY DEPARTMENT Provider Note   CSN: [id number] Arrival date & time: 12/10/18  1839     History   Chief Complaint Chief Complaint  Patient presents with   Wound Infection    HPI Mackenzie Maxwell is a 28 y.o. female.     The history is provided by the patient and medical records.    28 year old female with history of asthma, depression, migraine headaches, gestational hypertension, presenting to the ED with possible wound infection.  Patient had C-section on 10/03/2018, procedure went fairly well, she did have some kind of localized skin reaction to material used during surgery.  States she then had issues with her incision healing so she did have some cauterization of the skin.  She completed 2-week course of antibiotics, but continues to have intermittent redness and wounds to her pelvic region.  States over the past few days she has felt very achy and fatigued with some pressure down in her pelvis.  She is currently on her menstrual cycle but denies any discharge or urinary symptoms.  She states she was seen by primary care earlier yesterday and started on course of Bactrim for early cellulitis.  States she is only taken 2 tablets thus far but feels like "something is not right".  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 Authorizing Provider  acetaminophen (TYLENOL) 500 MG tablet Take 2 tablets (1,000 mg total) by mouth every 6 (six) hours as needed for moderate pain. Do not take greater than 4,000mg  in 24 hours 10/05/18 10/05/19  [name], CNM  ferrous sulfate 325 (65 FE) MG tablet Take 1 tablet (325 mg total) by mouth daily with breakfast. 10/05/18   [name], CNM  Prenatal Vit-Fe Fumarate-FA (PRENATAL MULTIVITAMIN) TABS tablet Take 1 tablet by mouth daily at 12 noon. 10/05/18   [name], CNM    Family History Family History  Problem Relation Age of Onset   Diabetes Maternal Grandmother    Hypertension Maternal Grandmother     Social History Social History  Tobacco Use   Smoking status: Former Smoker    Packs/day: 1.00    Types: Cigarettes    Quit date: 09/26/2008    Years since quitting: 10.2   Smokeless tobacco: Never Used  Substance Use Topics   Alcohol use: Not Currently    Comment: social    Drug use: No     Allergies   Ibuprofen   Review of Systems Review of Systems  Gastrointestinal: Positive for abdominal pain.  Skin: Positive for wound.  All other systems reviewed and are negative.    Physical Exam Updated Vital Signs BP (!) 97/53 (BP Location: Right Arm)    Pulse 80    Temp 99.6 F (37.6 C) (Oral)    Resp 18    SpO2 100%   Physical Exam Vitals signs and nursing note reviewed.  Constitutional:      Appearance: She is well-developed.  HENT:     Head: Normocephalic and atraumatic.  Eyes:      Conjunctiva/sclera: Conjunctivae normal.     Pupils: Pupils are equal, round, and reactive to light.  Neck:     Musculoskeletal: Normal range of motion.  Cardiovascular:     Rate and Rhythm: Normal rate and regular rhythm.     Heart sounds: Normal heart sounds.  Pulmonary:     Effort: Pulmonary effort is normal.     Breath sounds: Normal breath sounds.  Abdominal:     General: Bowel sounds are normal.     Palpations: Abdomen is soft.     Comments: c-section incision appears well healed, no visible dehiscence, there is minimal erythema and tenderness, there are multiple pustules noted on the mons pubis that seem to be originating from hair follicles, no drainage  Musculoskeletal: Normal range of motion.  Skin:    General: Skin is warm and dry.  Neurological:     Mental Status: She is alert and oriented to person, place, and time.      ED Treatments / Results  Labs (all labs ordered are listed, but only abnormal results are displayed) Labs Reviewed  CBC WITH DIFFERENTIAL/PLATELET - Abnormal; Notable for the following components:      Result Value   WBC 11.2 (*)    MCV 77.7 (*)    RDW 17.0 (*)    Neutro Abs 8.2 (*)    All other components within normal limits  URINALYSIS, ROUTINE W REFLEX MICROSCOPIC - Abnormal; Notable for the following components:   Hgb urine dipstick MODERATE (*)    Leukocytes,Ua MODERATE (*)    Bacteria, UA RARE (*)    All other components within normal limits  POCT I-STAT EG7 - Abnormal; Notable for the following components:   pCO2, Ven 37.7 (*)    pO2, Ven 130.0 (*)    TCO2 21 (*)    Acid-base deficit 5.0 (*)    HCT 35.0 (*)    Hemoglobin 11.9 (*)    All other components within normal limits  CULTURE, BLOOD (ROUTINE X 2)  CULTURE, BLOOD (ROUTINE X 2)  LACTIC ACID, PLASMA  LACTIC ACID, PLASMA  I-STAT CREATININE, ED  I-STAT BETA HCG BLOOD, ED ([facility], [facility], [facility] ONLY)    EKG EKG Interpretation  Date/Time:  Friday December 10 2018 21:59:43  EDT Ventricular Rate:  88 PR Interval:  122 QRS Duration: 94 QT Interval:  344 QTC Calculation: 416 R Axis:   82 Text Interpretation:  Sinus rhythm with marked sinus arrhythmia Nonspecific ST and T wave abnormality Abnormal ECG No old tracing  to compare Confirmed by Ward, Cyril Mourning [phone]) on 12/11/2018 1:34:17 AM   Radiology Ct Abdomen Pelvis W Contrast  Result Date: 12/11/2018 CLINICAL DATA:  Recurring infections along cesarean section incision. EXAM: CT ABDOMEN AND PELVIS WITH CONTRAST TECHNIQUE: Multidetector CT imaging of the abdomen and pelvis was performed using the standard protocol following bolus administration of intravenous contrast. CONTRAST:  159mL OMNIPAQUE IOHEXOL 300 MG/ML  SOLN COMPARISON:  None. FINDINGS: LOWER CHEST: There is no basilar pleural or apical pericardial effusion. HEPATOBILIARY: The hepatic contours and density are normal. There is no intra- or extrahepatic biliary dilatation. The gallbladder is normal. PANCREAS: The pancreatic parenchymal contours are normal and there is no ductal dilatation. There is no peripancreatic fluid collection. SPLEEN: Normal. ADRENALS/URINARY TRACT: --Adrenal glands: Normal. --Right kidney/ureter: No hydronephrosis, nephroureterolithiasis, perinephric stranding or solid renal mass. --Left kidney/ureter: No hydronephrosis, nephroureterolithiasis, perinephric stranding or solid renal mass. --Urinary bladder: Normal for degree of distention STOMACH/BOWEL: --Stomach/Duodenum: There is no hiatal hernia or other gastric abnormality. The duodenal course and caliber are normal. --Small bowel: No dilatation or inflammation. --Colon: No focal abnormality. --Appendix: Normal. VASCULAR/LYMPHATIC: Normal course and caliber of the major abdominal vessels. No abdominal or pelvic lymphadenopathy. REPRODUCTIVE: Small dorsal fundal uterine fibroid.  No adnexal mass. MUSCULOSKELETAL. No bony spinal canal stenosis or focal osseous abnormality. OTHER: There is no  fluid collection or other focal abnormality along the course of the anterior pelvic incision site. IMPRESSION: No acute abnormality of the abdomen or pelvis. No fluid collection or other focal abnormality at the site of Caesarean section incision. Electronically Signed   By: Ulyses Jarred M.D.   On: 12/11/2018 04:00    Procedures Procedures (including critical care time)  Medications Ordered in ED Medications  fentaNYL (SUBLIMAZE) injection 50 mcg (50 mcg Intravenous Refused 12/11/18 0246)  sodium chloride 0.9 % bolus 1,000 mL (0 mLs Intravenous Stopped 12/11/18 0517)  acetaminophen (TYLENOL) tablet 1,000 mg (1,000 mg Oral Given 12/11/18 0311)  iohexol (OMNIPAQUE) 300 MG/ML solution 100 mL (100 mLs Intravenous Contrast Given 12/11/18 0322)     Initial Impression / Assessment and Plan / ED Course  I have reviewed the triage vital signs and the nursing notes.  Pertinent labs & imaging results that were available during my care of the patient were reviewed by me and considered in my medical decision making (see chart for details).  28 year old female presenting to the ED with possible wound infection.  She had C-section on 10/03/2018, had some issues with skin irritation from items use during surgery and also had some issues with postop wound infection.  She completed course of antibiotics but has continued having issues with skin lesion since.  She does have history of MRSA wounds but is never had MRSA bacteremia that she is aware of.  She has been feeling unwell today, low-grade fever, body aches, etc.  She has not had any nausea or vomiting.  No urinary symptoms, pelvic pain, or vaginal discharge.  She was seen by PCP earlier and started on course of Bactrim for early cellulitis, has only had 2 tablets thus far.  On exam, patient with low-grade fever but is nontoxic in appearance.  Her surgical incision appears well-healed, no areas of dehiscence or drainage.  She does have some pustular lesions on  her mons pubis but these seem to be originating from hair follicles.  There is no apparent drainage.  Minimal erythema.  Screening labs are overall reassuring.  Will obtain CT.  She is given IV fluids, Tylenol.  Blood cultures were drawn.  CT negative for any acute findings, no fluid collection noted.  Patient continues to appear well here.  I have informed her that blood cultures have been sent and she will be notified of any abnormal results.  She should continue bactrim as prescribed.  Follow-up with PCP and/or OB-GYN for re-check.  Return here for any new/acute changes.  Final Clinical Impressions(s) / ED Diagnoses   Final diagnoses:  Cellulitis of other specified site    ED Discharge Orders    None       Larene Pickett, PA-C 12/11/18 0629    Ward, Delice Bison, DO 12/11/18 [phone]

## 2018-12-16 LAB — CULTURE, BLOOD (ROUTINE X 2)
Culture: NO GROWTH
Culture: NO GROWTH

## 2018-12-28 ENCOUNTER — Ambulatory Visit: Payer: No Typology Code available for payment source | Admitting: Infectious Diseases

## 2018-12-29 ENCOUNTER — Ambulatory Visit (INDEPENDENT_AMBULATORY_CARE_PROVIDER_SITE_OTHER): Payer: No Typology Code available for payment source | Admitting: Infectious Diseases

## 2018-12-29 ENCOUNTER — Telehealth: Payer: Self-pay

## 2018-12-29 ENCOUNTER — Other Ambulatory Visit: Payer: Self-pay

## 2018-12-29 ENCOUNTER — Encounter: Payer: Self-pay | Admitting: Infectious Diseases

## 2018-12-29 VITALS — BP 125/81 | HR 68 | Temp 98.8°F

## 2018-12-29 DIAGNOSIS — L02221 Furuncle of abdominal wall: Secondary | ICD-10-CM | POA: Diagnosis not present

## 2018-12-29 DIAGNOSIS — L02429 Furuncle of limb, unspecified: Secondary | ICD-10-CM

## 2018-12-29 NOTE — Patient Instructions (Signed)
Trim fingernails short and rounded, keeping well rounded without hangnails. Soak fingers in dial soap solution for 10 minutes each day. Consider getting jeweler's gloves (white cloth) for sleep and to minimize new scratches and subsequent wound infections. Avoid using razors/shaving to groin to prevent razor burn as source of new infections. Consider using sitz baths with epsom salts when perineal wounds appear.

## 2018-12-29 NOTE — Telephone Encounter (Signed)
Per Dr. Prince Rome called [facility] urgent [facility] to obtain any labs/ office note for patient. Patient states that urgent care has done [facility] labs on her before. Left voicemail requesting copies of any lab work/ office note for MD to review. Will fax release of information to [facility] for lab work and office note as well.  [facility]  P:  [phone] [facility]: [place], [place]

## 2018-12-29 NOTE — Progress Notes (Signed)
Subjective:    Patient ID: Mackenzie Maxwell, female    DOB: Jul 14, 1990, 28 y.o.   MRN: [id number]  HPI The patient is a 28 y/o WF who went to [facility] urgent care in [place] over a year ago and was told she cultured positive for MRSA. No record of this with [place]. No active lesions today. Just completed bactrim course. No active lesions evident on today's exam aside from furunculosis to abdominal wall and thigh .    Past Medical History:  Diagnosis Date  . Asthma   . Depression   . Gestational hypertension   . Migraines     Past Surgical History:  Procedure Laterality Date  . CESAREAN SECTION N/A 10/03/2018   Procedure: CESAREAN SECTION;  Surgeon: Janyth Pupa, DO;  Location[facility];  Service: Obstetrics;  Laterality: N/A;  . TONSILLECTOMY       Family History  Problem Relation Age of Onset  . Diabetes Maternal Grandmother   . Hypertension Maternal Grandmother      Social History   Tobacco Use  . Smoking status: Former Smoker    Packs/day: 1.00    Types: Cigarettes    Quit date: 09/26/2008    Years since quitting: 10.2  . Smokeless tobacco: Never Used  Substance Use Topics  . Alcohol use: Not Currently    Comment: social   . Drug use: No      reports being sexually active. She reports using the following method of birth control/protection: None.   Outpatient Medications Prior to Visit  Medication Sig Dispense Refill  . acetaminophen (TYLENOL) 500 MG tablet Take 2 tablets (1,000 mg total) by mouth every 6 (six) hours as needed for moderate pain. Do not take greater than 4,000mg  in 24 hours (Patient not taking: Reported on 12/11/2018) 60 tablet 0  . ferrous sulfate 325 (65 FE) MG tablet Take 1 tablet (325 mg total) by mouth daily with breakfast. (Patient not taking: Reported on 12/11/2018) 30 tablet 2  . Prenatal Vit-Fe Fumarate-FA (PRENATAL MULTIVITAMIN) TABS tablet Take 1 tablet by mouth daily at 12 noon. 30 tablet 2  . sulfamethoxazole-trimethoprim (BACTRIM DS) 800-160  MG tablet Take 1 tablet by mouth 2 (two) times daily.     No facility-administered medications prior to visit.      No Active Allergies    Review of Systems  Constitutional: Positive for fatigue. Negative for chills and fever.  HENT: Negative for congestion, hearing loss and sinus pressure.   Eyes: Negative for photophobia, discharge, redness and visual disturbance.  Respiratory: Negative for apnea, cough, shortness of breath and wheezing.   Cardiovascular: Negative for chest pain and leg swelling.  Gastrointestinal: Negative for abdominal distention, abdominal pain, constipation, diarrhea, nausea and vomiting.  Endocrine: Negative for cold intolerance, heat intolerance, polydipsia and polyuria.  Genitourinary: Positive for genital sores. Negative for dysuria, flank pain, frequency, urgency, vaginal bleeding and vaginal discharge.  Musculoskeletal: Negative for arthralgias, back pain, joint swelling and neck pain.  Skin: Positive for rash. Negative for pallor.  Allergic/Immunologic: Negative for immunocompromised state.  Neurological: Negative for dizziness, seizures, speech difficulty, weakness and headaches.  Hematological: Does not bruise/bleed easily.  Psychiatric/Behavioral: Positive for sleep disturbance. Negative for agitation, confusion and hallucinations. The patient is nervous/anxious.        Objective:    Vitals:   12/29/18 0910  BP: 125/81  Pulse: 68  Temp: 98.8 F (37.1 C)   Physical Exam Gen: pleasant, NAD, A&Ox 3 Head: NCAT, no temporal wasting evident EENT:  PERRL, EOMI, MMM, adequate dentition Neck: supple, no JVD CV: NRRR, no murmurs evident Pulm: CTA bilaterally, no wheeze or retractions Abd: soft, NTND, +BS Extrems: 1noLE edema, 2+ pulses Skin: rare picker's papules not along thigh and abdominal wall, adequate skin turgor Neuro: CN II-XII grossly intact, no focal neurologic deficits appreciated, gait was not assessed, A&Ox 3   Labs: Lab Results   Component Value Date   WBC 11.2 (H) 12/10/2018   HGB 11.9 (L) 12/11/2018   HCT 35.0 (L) 12/11/2018   MCV 77.7 (L) 12/10/2018   PLT 332 12/10/2018   Lab Results  Component Value Date   NA 138 12/11/2018   K 3.6 12/11/2018   CL 108 10/03/2018   CO2 23 10/03/2018   GLUCOSE 103 (H) 10/03/2018   BUN 6 10/03/2018   CREATININE 0.70 12/11/2018   CALCIUM 8.7 (L) 10/03/2018   No results found for: CRP     Assessment & Plan:  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  If concern remains the patient should have wound cultures obtained for any lesion she has so that empiric treatment moving forward may be properly chosen.  I reviewed hand hygiene ad nauseam with the patient today as many of her wounds that were seen on exam are unintentionally self-inflicted.  I discussed the use of white cloth gloves, meditation and relaxation techniques, and heavy emphasis on hand hygiene and nail hygiene to avoid scratching and further inflation of the wounds did lead to infection.

## 2020-09-19 ENCOUNTER — Emergency Department (HOSPITAL_COMMUNITY)
Admission: EM | Admit: 2020-09-19 | Discharge: 2020-09-20 | Disposition: A | Discharge status: Home / Self Care | Payer: No Typology Code available for payment source | Attending: Emergency Medicine | Admitting: Emergency Medicine

## 2020-09-19 ENCOUNTER — Other Ambulatory Visit: Payer: Self-pay

## 2020-09-19 ENCOUNTER — Emergency Department (HOSPITAL_COMMUNITY): Payer: No Typology Code available for payment source

## 2020-09-19 ENCOUNTER — Encounter (HOSPITAL_COMMUNITY): Payer: Self-pay

## 2020-09-19 DIAGNOSIS — Z87891 Personal history of nicotine dependence: Secondary | ICD-10-CM | POA: Diagnosis not present

## 2020-09-19 DIAGNOSIS — R509 Fever, unspecified: Secondary | ICD-10-CM | POA: Diagnosis present

## 2020-09-19 DIAGNOSIS — J45909 Unspecified asthma, uncomplicated: Secondary | ICD-10-CM | POA: Insufficient documentation

## 2020-09-19 DIAGNOSIS — U071 COVID-19: Secondary | ICD-10-CM | POA: Diagnosis not present

## 2020-09-19 LAB — COMPREHENSIVE METABOLIC PANEL
ALT: 13 U/L (ref 0–44)
AST: 18 U/L (ref 15–41)
Albumin: 3.9 g/dL (ref 3.5–5.0)
Alkaline Phosphatase: 34 U/L — ABNORMAL LOW (ref 38–126)
Anion gap: 8 (ref 5–15)
BUN: 11 mg/dL (ref 6–20)
CO2: 20 mmol/L — ABNORMAL LOW (ref 22–32)
Calcium: 8.8 mg/dL — ABNORMAL LOW (ref 8.9–10.3)
Chloride: 107 mmol/L (ref 98–111)
Creatinine, Ser: 0.85 mg/dL (ref 0.44–1.00)
GFR, Estimated: >60 mL/min (ref >60)
Glucose, Bld: 91 mg/dL (ref 70–99)
Potassium: 4 mmol/L (ref 3.5–5.1)
Sodium: 135 mmol/L (ref 135–145)
Total Bilirubin: 0.4 mg/dL (ref 0.3–1.2)
Total Protein: 6.4 g/dL — ABNORMAL LOW (ref 6.5–8.1)

## 2020-09-19 LAB — CBC WITH DIFFERENTIAL/PLATELET
Abs Immature Granulocytes: 0.01 10*3/uL (ref 0.00–0.07)
Basophils Absolute: 0 10*3/uL (ref 0.0–0.1)
Basophils Relative: 1 %
Eosinophils Absolute: 0.3 10*3/uL (ref 0.0–0.5)
Eosinophils Relative: 6 %
HCT: 34.6 % — ABNORMAL LOW (ref 36.0–46.0)
Hemoglobin: 12.1 g/dL (ref 12.0–15.0)
Immature Granulocytes: 0 %
Lymphocytes Relative: 22 %
Lymphs Abs: 1 10*3/uL (ref 0.7–4.0)
MCH: 27.1 pg (ref 26.0–34.0)
MCHC: 35 g/dL (ref 30.0–36.0)
MCV: 77.6 fL — ABNORMAL LOW (ref 80.0–100.0)
Monocytes Absolute: 0.7 10*3/uL (ref 0.1–1.0)
Monocytes Relative: 16 %
Neutro Abs: 2.4 10*3/uL (ref 1.7–7.7)
Neutrophils Relative %: 55 %
Platelets: 276 10*3/uL (ref 150–400)
RBC: 4.46 MIL/uL (ref 3.87–5.11)
RDW: 14.5 % (ref 11.5–15.5)
WBC: 4.4 10*3/uL (ref 4.0–10.5)
nRBC: 0 % (ref 0.0–0.2)

## 2020-09-19 LAB — URINALYSIS, ROUTINE W REFLEX MICROSCOPIC
Bacteria, UA: NONE SEEN
Bilirubin Urine: NEGATIVE
Glucose, UA: NEGATIVE mg/dL
Hgb urine dipstick: NEGATIVE
Ketones, ur: NEGATIVE mg/dL
Nitrite: NEGATIVE
Protein, ur: NEGATIVE mg/dL
Specific Gravity, Urine: 1.02 (ref 1.005–1.030)
pH: 8 (ref 5.0–8.0)

## 2020-09-19 LAB — I-STAT BETA HCG BLOOD, ED (MC, WL, AP ONLY): I-stat hCG, quantitative: <5 m[IU]/mL (ref <5)

## 2020-09-19 MED ORDER — ACETAMINOPHEN 500 MG PO TABS
1000.0000 mg | ORAL_TABLET | Freq: Once | ORAL | Status: AC
  Administered 2020-09-19: 1000 mg via ORAL
  Filled 2020-09-19: qty 2

## 2020-09-19 NOTE — ED Triage Notes (Signed)
Fever, cough and body aches x4 days. Took at home covid test and was positive. covid vaccinated.

## 2020-09-19 NOTE — ED Provider Notes (Signed)
Emergency Medicine Provider Triage Evaluation Note  Mackenzie Maxwell 30 y.o. female was evaluated in triage.  Pt complains of fatigue, generalized body aches, chest pain, cough, fever, decreased appetite that has been ongoing for the last 3 days.  Reports that she had an at-home COVID test that was positive.  She has been vaccinated.  No booster.  She has a history of asthma.  She reports she was hospitalized for asthma as a child but none since adulthood.  She has not had any abdominal pain, vomiting.  Review of Systems  Positive: Generalized body ache, fatigue, fever, decreased appetite Negative: Abdominal pain, vomiting.  Physical Exam  BP 134/82   Pulse 70   Temp 98.2 F (36.8 C) (Oral)   Resp 18   Ht 5\' 4"  (1.626 m)   Wt 65.8 kg   SpO2 100%   BMI 24.89 kg/m  Gen:   Awake, no distress   HEENT:  Atraumatic  Resp:  Normal effort.  Able to speak in full sentences without any difficulty.  No evidence of respiratory distress. Cardiac:  Normal rate  Abd:   Nondistended, nontender  MSK:   Moves extremities without difficulty  Neuro:  Speech clear   Medical Decision Making  Medically screening exam initiated at 8:32 PM  Appropriate orders placed.  Mackenzie Maxwell was informed that the remainder of the evaluation will be completed by another provider, this initial triage assessment does not replace that evaluation, and the importance of remaining in the ED until their evaluation is complete.  Clinical Impression  COVID   Portions of this note were generated with Dragon dictation software. Dictation errors may occur despite best attempts at proofreading.     Volanda Napoleon, PA-C 09/19/20 2034    Lajean Saver, MD 09/19/20 2047

## 2020-09-20 MED ORDER — IBUPROFEN 800 MG PO TABS
800.0000 mg | ORAL_TABLET | Freq: Once | ORAL | Status: AC
  Administered 2020-09-20: 800 mg via ORAL
  Filled 2020-09-20: qty 1

## 2020-09-20 NOTE — ED Provider Notes (Signed)
[facility] Provider Note   CSN: [id number] Arrival date & time: 09/19/20  2020     History Chief Complaint  Patient presents with  . Generalized Body Aches  . Fever  . Cough    [facility] is a 30 y.o. female.  30 y/o female who presents with 3 day history of fevers, generalized body aches, headaches, fatigue, and cough. She notes some shortness of breath with exertion and some "brain fog". Tried tylenol and aleive for body aches with some relief. Denies n/v, abd pain, chest pain. Took an at home covid test yesterday that was positive. Has been vaccinated x2 against covid but no booster. Had recent contact with a friend who just tested positive. History of asthma and seasonal allergies which she thinks has been contributing to her overall fatigue.   Fever Associated symptoms: cough   Cough Associated symptoms: fever        Past Medical History:  Diagnosis Date  . Asthma   . Depression   . Gestational hypertension   . Migraines     Patient Active Problem List   Diagnosis Date Noted  . Gestational hypertension 10/03/2018  . Pregnancy-induced hypertension 09/29/2018  . Depressive disorder 04/16/2018  . Rubella non-immune status, antepartum 04/02/2018  . 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 Migraine 02/14/2016  . Asthma 10/02/2011    Past Surgical History:  Procedure Laterality Date  . CESAREAN SECTION N/A 10/03/2018   Procedure: CESAREAN SECTION;  Surgeon: Janyth Pupa, DO;  Location: [facility];  Service: Obstetrics;  Laterality: N/A;  . TONSILLECTOMY       OB History    Gravida  2   Para  1   Term  1   Preterm      AB      Living  1     SAB      IAB      Ectopic      Multiple      Live Births  1        Obstetric Comments  Hemorrhage of left adrenal gland         Family History  Problem Relation Age of Onset  . Diabetes Maternal Grandmother    . Hypertension Maternal Grandmother     Social History   Tobacco Use  . Smoking status: Former Smoker    Packs/day: 1.00    Types: Cigarettes    Quit date: 09/26/2008    Years since quitting: 11.9  . Smokeless tobacco: Never Used  Vaping Use  . Vaping Use: Never used  Substance Use Topics  . Alcohol use: Not Currently    Comment: social   . Drug use: No    Home Medications Prior to Admission medications   Medication Sig Start Date End Date Taking? Authorizing Provider  ferrous sulfate 325 (65 FE) MG tablet Take 1 tablet (325 mg total) by mouth daily with breakfast. Patient not taking: Reported on 12/11/2018 10/05/18   [name], CNM  Prenatal Vit-Fe Fumarate-FA (PRENATAL MULTIVITAMIN) TABS tablet Take 1 tablet by mouth daily at 12 noon. 10/05/18   [name], CNM  sulfamethoxazole-trimethoprim (BACTRIM DS) 800-160 MG tablet Take 1 tablet by mouth 2 (two) times daily.    [provider]    Allergies    Patient has no known allergies.  Review of Systems   Review of Systems  Constitutional: Positive for fever.  Respiratory: Positive  for cough.   Ten systems reviewed and are negative for acute change, except as noted in the HPI.    Physical Exam Updated Vital Signs BP 125/87   Pulse 76   Temp 99 F (37.2 C) (Oral)   Resp 12   Ht 5\' 5"  (1.651 m)   Wt 120 kg   SpO2 98%   BMI 44.02 kg/m   Physical Exam Vitals and nursing note reviewed.  Constitutional:      General: She is not in acute distress.    Appearance: She is well-developed. She is not diaphoretic.     Comments: Nontoxic appearing and in NAD  HENT:     Head: Normocephalic and atraumatic.  Eyes:     General: No scleral icterus.    Conjunctiva/sclera: Conjunctivae normal.  Neck:     Comments: No meningismus Cardiovascular:     Rate and Rhythm: Normal rate and regular rhythm.     Pulses: Normal pulses.  Pulmonary:     Effort: Pulmonary effort is normal. No respiratory distress.      Breath sounds: No stridor. No wheezing or rales.     Comments: Lungs CTAB. Respirations even and unlabored. Musculoskeletal:        General: Normal range of motion.     Cervical back: Normal range of motion.  Skin:    General: Skin is warm and dry.     Coloration: Skin is not pale.     Findings: No erythema or rash.  Neurological:     Mental Status: She is alert and oriented to person, place, and time.     Coordination: Coordination normal.  Psychiatric:        Behavior: Behavior normal.     ED Results / Procedures / Treatments   Labs (all labs ordered are listed, but only abnormal results are displayed) Labs Reviewed  COMPREHENSIVE METABOLIC PANEL - Abnormal; Notable for the following components:      Result Value   CO2 20 (*)    Calcium 8.8 (*)    Total Protein 6.4 (*)    Alkaline Phosphatase 34 (*)    All other components within normal limits  CBC WITH DIFFERENTIAL/PLATELET - Abnormal; Notable for the following components:   HCT 34.6 (*)    MCV 77.6 (*)    All other components within normal limits  URINALYSIS, ROUTINE W REFLEX MICROSCOPIC - Abnormal; Notable for the following components:   APPearance HAZY (*)    Leukocytes,Ua TRACE (*)    All other components within normal limits  I-STAT BETA HCG BLOOD, ED ([facility], [facility], [facility] ONLY)    EKG None  Radiology DG Chest 2 View  Result Date: 09/19/2020 CLINICAL DATA:  Fever cough and body aches. EXAM: CHEST - 2 VIEW COMPARISON:  February 14, 2008 FINDINGS: The heart size and mediastinal contours are within normal limits. Both lungs are clear. The visualized skeletal structures are unremarkable. IMPRESSION: No active cardiopulmonary disease. Electronically Signed   By: Virgina Norfolk M.D.   On: 09/19/2020 21:04    Procedures Procedures   Medications Ordered in ED Medications  acetaminophen (TYLENOL) tablet 1,000 mg (1,000 mg Oral Given 09/19/20 2041)  ibuprofen (ADVIL) tablet 800 mg (800 mg Oral Given 09/20/20 0222)    ED  Course  I have reviewed the triage vital signs and the nursing notes.  Pertinent labs & imaging results that were available during my care of the patient were reviewed by me and considered in my medical decision making (see chart for  details).    MDM Rules/Calculators/A&P                          30 y/o female presenting for symptoms c/w COVID; hx of positive at home test. Febrile on arrival which has defervesced with antipyretics. CXR negative for acute infiltrate. Labs generally reassuring. Patient will be discharged with symptomatic treatment instructions. Return precautions discussed and provided. Patient discharged in stable condition with no unaddressed concerns.  [facility] was evaluated in Emergency Department on 09/20/2020 for the symptoms described in the history of present illness. She was evaluated in the context of the global COVID-19 pandemic, which necessitated consideration that the patient might be at risk for infection with the SARS-CoV-2 virus that causes COVID-19. Institutional protocols and algorithms that pertain to the evaluation of patients at risk for COVID-19 are in a state of rapid change based on information released by regulatory bodies including the CDC and federal and state organizations. These policies and algorithms were followed during the patient's care in the ED.   Final Clinical Impression(s) / ED Diagnoses Final diagnoses:  COVID-19 virus infection    Rx / DC Orders ED Discharge Orders    None       Antonietta Breach, PA-C 09/20/20 5726    Lorelle Gibbs, DO 09/20/20 2035

## 2020-09-20 NOTE — Discharge Instructions (Signed)
Quarantine away from other individuals for an additional 5 days once fever-free for 24 hours. Take tylenol for fever, headaches, body aches. You may alternate this with ibuprofen, if desired. Use as instructed on the box/bottle. Drink plenty of fluids to prevent dehydration. You may continue to use other over-the-counter remedies for symptom control, if desired. Return for new or concerning symptoms such as worsening shortness of breath, coughing up blood, persistent vomiting, loss of consciousness.

## 2020-09-20 NOTE — ED Notes (Signed)
E-signature pad unavailable at time of pt discharge. This RN discussed discharge materials with pt and answered all pt questions. Pt stated understanding of discharge material. ? ?

## 2021-04-04 ENCOUNTER — Ambulatory Visit
Admission: EM | Admit: 2021-04-04 | Discharge: 2021-04-04 | Disposition: A | Discharge status: Home / Self Care | Payer: No Typology Code available for payment source

## 2021-04-04 DIAGNOSIS — L02415 Cutaneous abscess of right lower limb: Secondary | ICD-10-CM | POA: Diagnosis not present

## 2021-04-04 MED ORDER — CEFTRIAXONE SODIUM 1 G IJ SOLR
1.0000 g | Freq: Once | INTRAMUSCULAR | Status: AC
Start: 2021-04-04 — End: 2021-04-04
  Administered 2021-04-04: 15:00:00 1 g via INTRAMUSCULAR

## 2021-04-04 MED ORDER — CLINDAMYCIN HCL 150 MG PO CAPS: 450.0000 mg | ORAL_CAPSULE | Freq: Three times a day (TID) | ORAL | 0 refills | Status: AC

## 2021-04-04 NOTE — ED Provider Notes (Signed)
[facility]    CSN: [id number] Arrival date & time: 04/04/21  1343   History   Chief Complaint Chief Complaint  Patient presents with   Abscess   HPI [facility] is a 30 y.o. female. Pt states she she has a boil to her right inner thigh present for 48 hours, patient states that she noticed it was bloody and draining this morning.  Patient states area is diffusely tender, red, hot and swollen.  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  Patient states she recently finished a course of Bactrim for UTI, EMR reviewed, I do not see this prescription.  Patient denies fever, myalgia, body ache.  Patient states she has not tried anything to treat the area.  The history is provided by the patient.   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 Authorizing Provider  clindamycin (CLEOCIN) 150 MG capsule Take 3 capsules (450 mg total) by mouth 3 (three) times daily for 7 days. 04/04/21 04/11/21 Yes Lynden Oxford  Scales, PA-C  tirzepatide [facility]) 2.5 MG/0.5ML Pen Inject 2.5 mg into the skin once a week.   Yes [provider]  Prenatal Vit-Fe Fumarate-FA (PRENATAL MULTIVITAMIN) TABS tablet Take 1 tablet by mouth daily at 12 noon. 10/05/18   [name], CNM   Family History Family History  Problem Relation Age of Onset   Diabetes Maternal Grandmother    Hypertension Maternal Grandmother    Social History Social History   Tobacco Use   Smoking status: Former    Packs/day: 1.00    Types: Cigarettes    Quit date: 09/26/2008    Years since quitting: 12.5   Smokeless tobacco: Never  Vaping Use   Vaping Use: Never used  Substance Use Topics   Alcohol use: Not Currently    Comment: social    Drug use: No   Allergies   Patient has no known allergies.  Review of Systems Review of Systems Pertinent findings noted in history of present illness.   Physical Exam Triage Vital Signs ED Triage Vitals  Enc Vitals Group     BP 03/15/21 0827 (!) 147/82     Pulse Rate 03/15/21 0827 72  Resp 03/15/21 0827 18     Temp 03/15/21 0827 98.3 F (36.8 C)     Temp Source 03/15/21 0827 Oral     SpO2 03/15/21 0827 98 %     Weight --      Height --      Head Circumference --      Peak Flow --      Pain Score 03/15/21 0826 5     Pain Loc --      Pain Edu? --      Excl. in [place]? --    No data found.  Updated Vital Signs BP (!) 143/84 (BP Location: Right Arm)   Pulse 82   Temp 97.7 F (36.5 C) (Oral)   Resp 20   LMP 03/07/2021   SpO2 99%   Visual Acuity Right Eye Distance:   Left Eye Distance:   Bilateral Distance:    Right Eye Near:   Left Eye Near:    Bilateral Near:     Physical Exam Vitals and nursing note reviewed.  Constitutional:      General: She is not in acute distress.    Appearance: Normal appearance. She is not ill-appearing.  HENT:     Head: Normocephalic and atraumatic.  Eyes:     General: Lids are normal.        Right eye: No discharge.        Left  eye: No discharge.     Extraocular Movements: Extraocular movements intact.     Conjunctiva/sclera: Conjunctivae normal.     Right eye: Right conjunctiva is not injected.     Left eye: Left conjunctiva is not injected.  Neck:     Trachea: Trachea and phonation normal.  Cardiovascular:     Rate and Rhythm: Normal rate and regular rhythm.     Pulses: Normal pulses.     Heart sounds: Normal heart sounds. No murmur heard.   No friction rub. No gallop.  Pulmonary:     Effort: Pulmonary effort is normal. No accessory muscle usage, prolonged expiration or respiratory distress.     Breath sounds: Normal breath sounds. No stridor, decreased air movement or transmitted upper airway sounds. No decreased breath sounds, wheezing, rhonchi or rales.  Chest:     Chest wall: No tenderness.  Musculoskeletal:        General: Normal range of motion.     Cervical back: Normal range of motion and neck supple. Normal range of motion.  Lymphadenopathy:     Cervical: No cervical adenopathy.  Skin:    General: Skin is warm and dry.     Findings: No erythema or rash.     Comments: Abscess on upper right inner thigh approximately 2 cm across, fluctuant, draining blood and purulent fluid, diffusely tender to palpation, erythematous  Neurological:     General: No focal deficit present.     Mental Status: She is alert and oriented to person, place, and time.  Psychiatric:        Mood and Affect: Mood normal.        Behavior: Behavior normal.   UC Treatments / Results  Labs (all labs ordered are listed, but only abnormal results are displayed)  Labs Reviewed - No data to display  EKG  Radiology No results found.  Procedures Procedures (including critical care time)  Medications Ordered in UC Medications  cefTRIAXone (ROCEPHIN) injection 1 g (has no administration in time range)    Initial Impression / Assessment and Plan /  UC Course  I have reviewed the triage vital signs and the nursing  notes.  Pertinent labs & imaging results that were available during my care of the patient were reviewed by me and considered in my medical decision making (see chart for details).      Given proximity to genital area, and current apparent drainage from the wound, I&D is not indicated at this time.  Patient was provided with an injection of ceftriaxone and a follow-up prescription for clindamycin for 7 days.  MRSA colonization recommendations provided.  Follow-up precautions provided.  Final Clinical Impressions(s) / UC Diagnoses   Final diagnoses:  Abscess of right thigh     Discharge Instructions      You received an injection of an antibiotic called Rocephin in the office today, this should begin to resolve your symptoms in the next 12 to 24 hours.  Please follow this up with a 7-day course of clindamycin, 3 capsules 3 times daily.  This medication often causes GI upset such as diarrhea, please feel free to use over-the-counter Imodium as needed for your symptoms if they are bothersome.  If you have not had resolution of this abscess and continue to have drainage from the abscess after 3 days of antibiotics, you will need imaging to make sure that you do not have a deeper pocket of pus that cannot be addressed with the antibiotics that I prescribed for you today.  I recommend that you go to the emergency room so that ultrasound can be done.     ED Prescriptions     Medication Sig Dispense Auth. Provider   clindamycin (CLEOCIN) 150 MG capsule Take 3 capsules (450 mg total) by mouth 3 (three) times daily for 7 days. 63 capsule Lynden Oxford Scales, PA-C      PDMP not reviewed this encounter.  Disposition Upon Discharge:  Patient presented with an acute illness with associated systemic symptoms and significant discomfort requiring urgent management. In my opinion, this is a condition that a prudent lay person (someone who possesses an average knowledge of health and medicine) may  potentially expect to result in complications if not addressed urgently such as respiratory distress, impairment of bodily function or dysfunction of bodily organs.   Routine symptom specific, illness specific and/or disease specific instructions were discussed with the patient and/or caregiver at length.   As such, the patient has been evaluated and assessed, work-up was performed and treatment was provided in alignment with urgent care protocols and evidence based medicine.  Patient/parent/caregiver has been advised that the patient may require follow up for further testing and treatment if the symptoms continue in spite of treatment, as clinically indicated and appropriate.  Patient/parent/caregiver has been advised to return to the [facility] or PCP in 3-5 days if no better; to PCP or the Emergency Department if new signs and symptoms develop, or if the current signs or symptoms continue to change or worsen for further workup, evaluation and treatment as clinically indicated and appropriate  The patient will follow up with their current PCP if and as advised. If the patient does not currently have a PCP we will assist them in obtaining one.   Patient/parent/caregiver verbalized understanding and agreement of plan as discussed.  All questions were addressed during visit.  Please see discharge instructions below for further details of plan.  Condition: stable for discharge home Home: take medications as prescribed; routine discharge instructions as discussed; follow up as advised.    Lynden Oxford Scales, PA-C 04/04/21  1428  

## 2021-04-04 NOTE — ED Triage Notes (Signed)
Pt states she she has a boil to her right inner thigh.  Started: 2 days ago

## 2021-04-04 NOTE — Discharge Instructions (Addendum)
You received an injection of an antibiotic called Rocephin in the office today, this should begin to resolve your symptoms in the next 12 to 24 hours.  Please follow this up with a 7-day course of clindamycin, 3 capsules 3 times daily.  This medication often causes GI upset such as diarrhea, please feel free to use over-the-counter Imodium as needed for your symptoms if they are bothersome.  If you have not had resolution of this abscess and continue to have drainage from the abscess after 3 days of antibiotics, you will need imaging to make sure that you do not have a deeper pocket of pus that cannot be addressed with the antibiotics that I prescribed for you today.  I recommend that you go to the emergency room so that ultrasound can be done.

## 2021-04-08 ENCOUNTER — Emergency Department (HOSPITAL_COMMUNITY)
Admission: EM | Admit: 2021-04-08 | Discharge: 2021-04-09 | Disposition: A | Discharge status: Home / Self Care | Payer: No Typology Code available for payment source | Attending: Emergency Medicine | Admitting: Emergency Medicine

## 2021-04-08 ENCOUNTER — Emergency Department (HOSPITAL_COMMUNITY): Payer: No Typology Code available for payment source

## 2021-04-08 ENCOUNTER — Other Ambulatory Visit: Payer: Self-pay

## 2021-04-08 DIAGNOSIS — J45909 Unspecified asthma, uncomplicated: Secondary | ICD-10-CM | POA: Diagnosis not present

## 2021-04-08 DIAGNOSIS — R519 Headache, unspecified: Secondary | ICD-10-CM | POA: Diagnosis not present

## 2021-04-08 DIAGNOSIS — R82998 Other abnormal findings in urine: Secondary | ICD-10-CM | POA: Diagnosis not present

## 2021-04-08 DIAGNOSIS — R202 Paresthesia of skin: Secondary | ICD-10-CM | POA: Insufficient documentation

## 2021-04-08 DIAGNOSIS — R030 Elevated blood-pressure reading, without diagnosis of hypertension: Secondary | ICD-10-CM | POA: Diagnosis not present

## 2021-04-08 DIAGNOSIS — Z87891 Personal history of nicotine dependence: Secondary | ICD-10-CM | POA: Insufficient documentation

## 2021-04-08 DIAGNOSIS — Z0130 Encounter for examination of blood pressure without abnormal findings: Secondary | ICD-10-CM

## 2021-04-08 LAB — URINALYSIS, ROUTINE W REFLEX MICROSCOPIC
Specific Gravity, Urine: 1.015 (ref 1.005–1.030)
pH: 7.5 (ref 5.0–8.0)

## 2021-04-08 LAB — CBC WITH DIFFERENTIAL/PLATELET
Abs Immature Granulocytes: 0.02 10*3/uL (ref 0.00–0.07)
Basophils Absolute: 0 10*3/uL (ref 0.0–0.1)
Basophils Relative: 1 %
Eosinophils Absolute: 0.2 10*3/uL (ref 0.0–0.5)
Eosinophils Relative: 3 %
HCT: 35.6 % — ABNORMAL LOW (ref 36.0–46.0)
Hemoglobin: 12.4 g/dL (ref 12.0–15.0)
Immature Granulocytes: 0 %
Lymphocytes Relative: 34 %
Lymphs Abs: 2.3 10*3/uL (ref 0.7–4.0)
MCH: 27.8 pg (ref 26.0–34.0)
MCHC: 34.8 g/dL (ref 30.0–36.0)
MCV: 79.8 fL — ABNORMAL LOW (ref 80.0–100.0)
Monocytes Absolute: 0.6 10*3/uL (ref 0.1–1.0)
Monocytes Relative: 8 %
Neutro Abs: 3.7 10*3/uL (ref 1.7–7.7)
Neutrophils Relative %: 54 %
Platelets: 333 10*3/uL (ref 150–400)
RBC: 4.46 MIL/uL (ref 3.87–5.11)
RDW: 15.5 % (ref 11.5–15.5)
WBC: 6.9 10*3/uL (ref 4.0–10.5)
nRBC: 0 % (ref 0.0–0.2)

## 2021-04-08 LAB — PREGNANCY, URINE: Preg Test, Ur: NEGATIVE

## 2021-04-08 LAB — BASIC METABOLIC PANEL
Anion gap: 9 (ref 5–15)
BUN: 14 mg/dL (ref 6–20)
CO2: 23 mmol/L (ref 22–32)
Calcium: 9.1 mg/dL (ref 8.9–10.3)
Chloride: 105 mmol/L (ref 98–111)
Creatinine, Ser: 0.69 mg/dL (ref 0.44–1.00)
GFR, Estimated: >60 mL/min (ref >60)
Glucose, Bld: 88 mg/dL (ref 70–99)
Potassium: 3.8 mmol/L (ref 3.5–5.1)
Sodium: 137 mmol/L (ref 135–145)

## 2021-04-08 LAB — URINALYSIS, MICROSCOPIC (REFLEX): RBC / HPF: >50 RBC/hpf (ref 0–5)

## 2021-04-08 NOTE — ED Provider Notes (Addendum)
[place] EMERGENCY DEPARTMENT Provider Note   CSN: [id number] Arrival date & time: 04/08/21  1713     History No chief complaint on file.   Mackenzie Maxwell is a 30 y.o. female.  Patient with a history of gestational hypertension 2 years ago presenting with elevated blood pressure, headache, tingling and numbness to her hands and feet for the past 8 hours.  She states she is having elevated blood pressure over the past 4 days ever since she was seen in urgent care on November 17.  She went there for an abscess to her right inner thigh which is improving.  She is currently on clindamycin for the past 4 days and was on Bactrim previous to this.  She reports she is measuring her blood pressure at home and has been on the 1 30-1 40 range but became 277 systolic tonight.  She saw her PCP earlier today who is not going to put her on any blood pressure medication but just watch it for now.  She became concerned tonight when her blood pressure was 160/100 and she was having tingling in her bilateral hands and feet.  Tingling started about 4 PM and has since resolved over the past 6 hours.  She has no weakness.  She has no difficulty speaking or difficulty swallowing.  No chest pain or shortness of breath.  No blurry vision or double vision though she "saw blood vessels in my eye earlier".  Her vision is at baseline now.  Denies any black spots or blurry vision. Reports her hand tingling and numbness is bilateral and has since resolved.  She feels the abscess to her groin is getting better. Blood pressure improved to 135/85 without intervention in the ED. Denies thunderclap onset headache.  Denies any nausea.  Denies any vomiting.  Denies any chest pain or shortness of breath No room spinning dizziness or lightheadedness  The history is provided by the patient.      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   Migraine 02/14/2016   Asthma 10/02/2011    Past Surgical History:  Procedure Laterality Date   CESAREAN SECTION N/A 10/03/2018   Procedure: CESAREAN SECTION;  Surgeon: Janyth Pupa, DO;  Location: [facility];  Service: Obstetrics;  Laterality: N/A;   TONSILLECTOMY       OB History     Gravida  2   Para  1   Term  1   Preterm      AB      Living  1      SAB      IAB      Ectopic      Multiple      Live Births  1        Obstetric Comments  Hemorrhage of left adrenal gland          Family History  Problem Relation Age of Onset   Diabetes Maternal Grandmother    Hypertension Maternal Grandmother     Social History   Tobacco Use   Smoking status: Former    Packs/day: 1.00  Types: Cigarettes    Quit date: 09/26/2008    Years since quitting: 12.5   Smokeless tobacco: Never  Vaping Use   Vaping Use: Never used  Substance Use Topics   Alcohol use: Not Currently    Comment: social    Drug use: No    Home Medications Prior to Admission medications   Medication Sig Start Date End Date Taking? Authorizing Provider  clindamycin (CLEOCIN) 150 MG capsule Take 3 capsules (450 mg total) by mouth 3 (three) times daily for 7 days. 04/04/21 04/11/21  Lynden Oxford Scales, PA-C  Prenatal Vit-Fe Fumarate-FA (PRENATAL MULTIVITAMIN) TABS tablet Take 1 tablet by mouth daily at 12 noon. 10/05/18   [name], CNM  tirzepatide [facility]) 2.5 MG/0.5ML Pen Inject 2.5 mg into the skin once a week.    [provider]    Allergies    Patient has no known allergies.  Review of Systems   Review of Systems  Constitutional:  Negative for activity  change, appetite change and fever.  HENT:  Negative for congestion.   Eyes:  Positive for visual disturbance.  Respiratory:  Negative for cough, chest tightness and shortness of breath.   Cardiovascular:  Negative for chest pain.  Gastrointestinal:  Negative for nausea and vomiting.  Genitourinary:  Negative for dysuria and hematuria.  Musculoskeletal:  Negative for arthralgias and myalgias.  Skin:  Negative for rash.  Neurological:  Positive for numbness and headaches. Negative for dizziness, seizures, syncope, facial asymmetry, weakness and light-headedness.   all other systems are negative except as noted in the HPI and PMH.   Physical Exam Updated Vital Signs BP 135/85 (BP Location: Right Arm)   Pulse 74   Temp 97.7 F (36.5 C)   Resp 14   SpO2 100%   Physical Exam Vitals and nursing note reviewed.  Constitutional:      General: She is not in acute distress.    Appearance: She is well-developed.  HENT:     Head: Normocephalic and atraumatic.     Mouth/Throat:     Pharynx: No oropharyngeal exudate.  Eyes:     Conjunctiva/sclera: Conjunctivae normal.     Pupils: Pupils are equal, round, and reactive to light.  Neck:     Comments: No meningismus. Cardiovascular:     Rate and Rhythm: Normal rate and regular rhythm.     Heart sounds: Normal heart sounds. No murmur heard. Pulmonary:     Effort: Pulmonary effort is normal. No respiratory distress.     Breath sounds: Normal breath sounds.  Chest:     Chest wall: No tenderness.  Abdominal:     Palpations: Abdomen is soft.     Tenderness: There is no abdominal tenderness. There is no guarding or rebound.  Genitourinary:    Comments: Chaperone present Microbiologist.  Patient has a healing abscess to her right medial thigh without fluctuance.  No current drainage.  There is mild surrounding erythema.  Some induration at the 12 o'clock position. No significant erythema or drainage or fluctuance Musculoskeletal:        General: No  tenderness. Normal range of motion.     Cervical back: Normal range of motion and neck supple.  Skin:    General: Skin is warm.  Neurological:     Mental Status: She is alert and oriented to person, place, and time.     Cranial Nerves: No cranial nerve deficit.     Motor: No abnormal muscle tone.     Coordination:  Coordination normal.     Comments:  5/5 strength throughout. CN 2-12 intact.Equal grip strength.   Psychiatric:        Behavior: Behavior normal.    ED Results / Procedures / Treatments   Labs (all labs ordered are listed, but only abnormal results are displayed) Labs Reviewed  URINALYSIS, ROUTINE W REFLEX MICROSCOPIC - Abnormal; Notable for the following components:      Result Value   Color, Urine RED (*)    APPearance TURBID (*)    Glucose, UA   (*)    Value: TEST NOT REPORTED DUE TO COLOR INTERFERENCE OF URINE PIGMENT   Hgb urine dipstick   (*)    Value: TEST NOT REPORTED DUE TO COLOR INTERFERENCE OF URINE PIGMENT   Bilirubin Urine   (*)    Value: TEST NOT REPORTED DUE TO COLOR INTERFERENCE OF URINE PIGMENT   Ketones, ur   (*)    Value: TEST NOT REPORTED DUE TO COLOR INTERFERENCE OF URINE PIGMENT   Protein, ur   (*)    Value: TEST NOT REPORTED DUE TO COLOR INTERFERENCE OF URINE PIGMENT   Nitrite   (*)    Value: TEST NOT REPORTED DUE TO COLOR INTERFERENCE OF URINE PIGMENT   Leukocytes,Ua   (*)    Value: TEST NOT REPORTED DUE TO COLOR INTERFERENCE OF URINE PIGMENT   All other components within normal limits  CBC WITH DIFFERENTIAL/PLATELET - Abnormal; Notable for the following components:   HCT 35.6 (*)    MCV 79.8 (*)    All other components within normal limits  URINALYSIS, MICROSCOPIC (REFLEX) - Abnormal; Notable for the following components:   Bacteria, UA FIELD OBSCURED BY RBC'S (*)    All other components within normal limits  URINE CULTURE  PREGNANCY, URINE  BASIC METABOLIC PANEL    EKG EKG Interpretation  Date/Time:  Monday April 08 2021  18:04:53 EST Ventricular Rate:  87 PR Interval:  134 QRS Duration: 78 QT Interval:  356 QTC Calculation: 428 R Axis:   77 Text Interpretation: Normal sinus rhythm Nonspecific ST and T wave abnormality Abnormal ECG ST changes improved Confirmed by Ezequiel Essex [phone]) on 04/08/2021 11:02:18 PM  Radiology CT Head Wo Contrast  Result Date: 04/09/2021 CLINICAL DATA:  Hypertension and headaches, initial encounter EXAM: CT HEAD WITHOUT CONTRAST TECHNIQUE: Contiguous axial images were obtained from the base of the skull through the vertex without intravenous contrast. COMPARISON:  11/18/2014 FINDINGS: Brain: No evidence of acute infarction, hemorrhage, hydrocephalus, extra-axial collection or mass lesion/mass effect. Vascular: No hyperdense vessel or unexpected calcification. Skull: Normal. Negative for fracture or focal lesion. Sinuses/Orbits: No acute finding. Other: None. IMPRESSION: No acute intracranial abnormality noted. Electronically Signed   By: Inez Catalina M.D.   On: 04/09/2021 00:12    Procedures Procedures   Medications Ordered in ED Medications - No data to display  ED Course  I have reviewed the triage vital signs and the nursing notes.  Pertinent labs & imaging results that were available during my care of the patient were reviewed by me and considered in my medical decision making (see chart for details).    MDM Rules/Calculators/A&P                          Elevated blood pressure with tingling in her bilateral hands and feet.  Blood pressure has improved on its own.  No lateralizing neurological deficits.  Low suspicion for CVA or TIA  Electrolytes  reassuring.  No evidence of endorgan damage  CT head is negative.  Low suspicion for acute neurosurgical or neurological emergency.  Blood pressure has spontaneously improved to the 223C systolic  Discussed follow-up with her PCP.  Keep a record of her blood pressure as she is doing.  We will not start medications at  this time. In terms of her thigh skin infection, it appears to be healing well with antibiotics at home.  No fluctuance appreciated today.  Continue antibiotics and local wound care  Return to the ED with severe unilateral headache, chest pain, shortness of breath, unilateral weakness, numbness or tingling or other concerns. Final Clinical Impression(s) / ED Diagnoses Final diagnoses:  Blood pressure check  Paresthesia    Rx / DC Orders ED Discharge Orders     None        Jasman Pfeifle, Annie Main, MD 04/09/21 0979    Ezequiel Essex, MD 04/09/21 [phone]

## 2021-04-08 NOTE — ED Provider Notes (Signed)
Emergency Medicine Provider Triage Evaluation Note  Mackenzie Maxwell , a 30 y.o. female  was evaluated in triage.  Pt complains of hypertension.  Blood pressure is elevated the last 2 weeks.  PCP aware and she is journaling.  Patient reports that she had numbness and tingling in her hands and feet bilaterally.  Also concerned about headache started today.  She is on a weight loss medicine, does not take medicine for hypertension.  Having intermittent blurry vision..  Was on Bactrim twice for UTIs, recently started clindamycin last Thursday due to abscess on the right thigh.  Review of Systems  Positive: Above Negative: ABOVE  Physical Exam  BP (!) 155/96 (BP Location: Right Arm)   Pulse 86   Temp 99.1 F (37.3 C) (Oral)   Resp 20   SpO2 100%  Gen:   Awake, no distress   Resp:  Normal effort  MSK:   Moves extremities without difficulty  Other:    Medical Decision Making  Medically screening exam initiated at 6:15 PM.  Appropriate orders placed.  Mackenzie Maxwell was informed that the remainder of the evaluation will be completed by another provider, this initial triage assessment does not replace that evaluation, and the importance of remaining in the ED until their evaluation is complete.     Mackenzie Raring, PA-C 04/08/21 1816    Mackenzie Lemming, MD 04/08/21 1958

## 2021-04-09 LAB — URINE CULTURE: Culture: <10000 — AB

## 2021-04-09 NOTE — ED Notes (Signed)
Pt verbalized understanding of d/c instructions, meds, and followup care. Denies questions. VSS, no distress noted. Steady gait to exit with all belongings.  ?

## 2021-04-09 NOTE — Discharge Instructions (Signed)
Your testing today shows no signs of a stroke.  Keep a record of your blood pressure and follow-up with your doctor.  Return to the ED with severe headache, unilateral weakness, numbness, tingling, difficulty speaking or difficulty swallowing, chest pain, shortness of breath, any other concerns.

## 2021-04-09 NOTE — ED Notes (Signed)
Pt tolerated orthostatic vitals well. Pt was assisted back into bed. Bed is locked in a low safe position call light within reach.

## 2021-04-09 NOTE — ED Notes (Signed)
Pt was able to ambulate well with no complaints total about 62ft. Pt was assisted back into bed. Bed is locked in a low safe position call light within reach.

## 2021-06-19 ENCOUNTER — Other Ambulatory Visit: Payer: Self-pay

## 2021-06-19 ENCOUNTER — Encounter (HOSPITAL_COMMUNITY): Payer: Self-pay | Admitting: Physical Therapy

## 2021-06-19 ENCOUNTER — Ambulatory Visit (HOSPITAL_COMMUNITY): Payer: No Typology Code available for payment source | Attending: Pain Medicine | Admitting: Physical Therapy

## 2021-06-19 DIAGNOSIS — R600 Localized edema: Secondary | ICD-10-CM | POA: Diagnosis not present

## 2021-06-19 NOTE — Addendum Note (Signed)
Addended by: Leeroy Cha on: 06/19/2021 04:43 PM   Modules accepted: Orders

## 2021-06-19 NOTE — Therapy (Signed)
Mackenzie Maxwell, [place], [address] Phone: [phone]   Fax:  [phone]  Physical Therapy Evaluation  Patient Details  Name: Mackenzie Maxwell MRN: [id number] Date of Birth: 1990/06/29 Referring Provider (PT): Candida Peeling   Encounter Date: 06/19/2021   PT End of Session - 06/19/21 1427     Visit Number 1    Number of Visits 2    Date for PT Re-Evaluation 07/19/21    Authorization Type [employer]    Progress Note Due on Visit 1    PT Start Time 1320    PT Stop Time 1430    PT Time Calculation (min) 70 min    Activity Tolerance Patient tolerated treatment well    Behavior During Therapy [facility] for tasks assessed/performed             Past Medical History:  Diagnosis Date   Asthma    Depression    Gestational hypertension    Migraines     Past Surgical History:  Procedure Laterality Date   CESAREAN SECTION N/A 10/03/2018   Procedure: CESAREAN SECTION;  Surgeon: Janyth Pupa, DO;  Location: [facility];  Service: Obstetrics;  Laterality: N/A;   TONSILLECTOMY      There were no vitals filed for this visit.    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  She has been wearing compression garments for about two years now; she exchanges the garments   She does not have a compression pump.  She states that she does have pain with the swelling, especially if she is on her feet to long.  She is a cosmotolgist.  She states she has increased pain after about an hour.  The pt states that her mother and her sisters and her brother all have very large legs.    Pertinent History HTN    How long can you stand comfortably? an hour    Currently in Pain? Yes    Pain Score 8     Pain Location Leg    Pain Orientation Left;Right    Pain Descriptors / Indicators Aching;Throbbing;Tightness    Pain Onset More than a month ago    Pain Frequency Intermittent    Aggravating  Factors  standing    Pain Relieving Factors not sure    Effect of Pain on Daily Activities limits                [facility] PT Assessment - 06/19/21 0001       Assessment   Medical Diagnosis Lipo-lymphedema    Referring Provider (PT) Candida Peeling    Onset Date/Surgical Date 07/08/21    Next MD Visit unknown    Prior Therapy none      Precautions   Precautions --   cellulitis     Balance Screen   Has the patient fallen in the past 6 months No    Has the patient had a decrease in activity level because of a fear of falling?  No    Is the patient reluctant to leave their home because of a fear of falling?  No      Cognition   Overall Cognitive Status Palpation;  Within Functional Limits for tasks assessed  Noted induration especially in lower thigh               LYMPHEDEMA/ONCOLOGY QUESTIONNAIRE - 06/19/21 0001  What other symptoms do you have   Are you Having Heaviness or Tightness Yes    Are you having Pain Yes    Are you having pitting edema Yes    Body Site LE    Is it Hard or Difficult finding clothes that fit Yes    Do you have infections No    Is there Decreased scar mobility --   not now but has had in the past   Stemmer Sign No      Lymphedema Stage   Stage STAGE 2 SPONTANEOUSLY IRREVERSIBLE      Lymphedema Assessments   Lymphedema Assessments Lower extremities      Right Lower Extremity Lymphedema   30 cm Proximal to Suprapatella 78.8 cm    20 cm Proximal to Suprapatella 70.3 cm    10 cm Proximal to Suprapatella 61.5 cm    At Midpatella/Popliteal Crease 45.2 cm    30 cm Proximal to Floor at Lateral Plantar Foot 47.3 cm    20 cm Proximal to Floor at Lateral Plantar Foot 40.7 1    10  cm Proximal to Floor at Lateral Malleoli 26.3 cm    Circumference of ankle/heel 32.8 cm.    5 cm Proximal to 1st MTP Joint 24 cm    Across MTP Joint 24 cm      Left Lower Extremity Lymphedema   30 cm Proximal to Suprapatella 77 cm    20 cm Proximal to  Suprapatella 69 cm    10 cm Proximal to Suprapatella 61.8 cm    At Midpatella/Popliteal Crease 45.7 cm    30 cm Proximal to Floor at Lateral Plantar Foot 49 cm    20 cm Proximal to Floor at Lateral Plantar Foot 42 cm    10 cm Proximal to Floor at Lateral Malleoli 29.2 cm    Circumference of ankle/heel 33 cm.    5 cm Proximal to 1st MTP Joint 23.5 cm    Across MTP Joint 23 cm                     Objective measurements completed on examination: See above findings.                PT Education - 06/19/21 1425     Education Details Pt may have lipo-lymphedema, LE exercises, self manual and use of a pump will assist in decreasing volume.  Pt should be wearing thigh high compression and not knee high compression.  Choices for treatment    Person(s) Educated Patient    Methods Explanation    Comprehension Verbalized understanding;Returned demonstration              PT Short Term Goals - 06/19/21 1436       PT SHORT TERM GOAL #1   Title Pt to begin exercises and self manual on a daily basis    Time 2    Period Days    Status New    Target Date 06/21/21      PT SHORT TERM GOAL #2   Title PT to have and be using a compression pump on a daily basis    Time 2    Period Weeks    Status New    Target Date 07/03/21      PT SHORT TERM GOAL #3   Title PT to have and to have obtained thigh high compression garments 20-25mmhg    Time 2    Period Weeks    Status  New    Target Date 07/03/21                       Plan - 06/19/21 1428     Clinical Impression Statement Mackenzie Maxwell is a 31 yo female who had been dx iwth lipedema and has been wearing compression garments for years.  She is wearing knee high compression garment.  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  At this time, however, [name] is coming from [place] and will not be able to committ to  coming to therapy three days a week for 90 minutes.  Therefore, treatment plan is as follows:  Complete exercises 1-2 x a day, complete self massage once a day, a compression pump will be requested and once the pt has the compression pump she should start pumping once a day.    Examination-Activity Limitations Stand    Stability/Clinical Decision Making Stable/Uncomplicated    Clinical Decision Making Moderate    Rehab Potential Good    PT Frequency 3x / week    PT Duration 4 weeks   If pt is able to begin treatment she is going to speak with her employer.   PT Treatment/Interventions Compression bandaging;Manual lymph drainage    PT Next Visit Plan Answer any questions begin CDT.    PT Home Exercise Plan self manual and exercises.             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

## 2021-06-21 ENCOUNTER — Ambulatory Visit (HOSPITAL_COMMUNITY): Payer: No Typology Code available for payment source

## 2021-06-25 ENCOUNTER — Encounter (HOSPITAL_COMMUNITY): Payer: No Typology Code available for payment source | Admitting: Physical Therapy

## 2021-06-27 ENCOUNTER — Encounter (HOSPITAL_COMMUNITY): Payer: No Typology Code available for payment source | Admitting: Physical Therapy

## 2021-06-28 ENCOUNTER — Encounter (HOSPITAL_COMMUNITY): Payer: No Typology Code available for payment source

## 2021-07-01 ENCOUNTER — Encounter (HOSPITAL_COMMUNITY): Payer: No Typology Code available for payment source | Admitting: Physical Therapy

## 2021-07-03 ENCOUNTER — Encounter (HOSPITAL_COMMUNITY): Payer: No Typology Code available for payment source | Admitting: Physical Therapy

## 2021-07-05 ENCOUNTER — Encounter (HOSPITAL_COMMUNITY): Payer: No Typology Code available for payment source | Admitting: Physical Therapy

## 2021-07-08 ENCOUNTER — Encounter (HOSPITAL_COMMUNITY): Payer: No Typology Code available for payment source | Admitting: Physical Therapy

## 2021-07-10 ENCOUNTER — Encounter (HOSPITAL_COMMUNITY): Payer: No Typology Code available for payment source

## 2021-07-12 ENCOUNTER — Encounter (HOSPITAL_COMMUNITY): Payer: No Typology Code available for payment source

## 2021-07-15 ENCOUNTER — Encounter (HOSPITAL_COMMUNITY): Payer: No Typology Code available for payment source | Admitting: Physical Therapy

## 2021-07-17 ENCOUNTER — Encounter (HOSPITAL_COMMUNITY): Payer: No Typology Code available for payment source | Admitting: Physical Therapy

## 2021-07-19 ENCOUNTER — Encounter (HOSPITAL_COMMUNITY): Payer: No Typology Code available for payment source | Admitting: Physical Therapy

## 2021-09-16 IMAGING — DX DG CHEST 2V
2 series · 2 of 2 positions shown · non-contrast
Comparison: February 14, 2008

CLINICAL DATA: Fever cough and body aches.

EXAM:
CHEST - 2 VIEW

[chest pa]
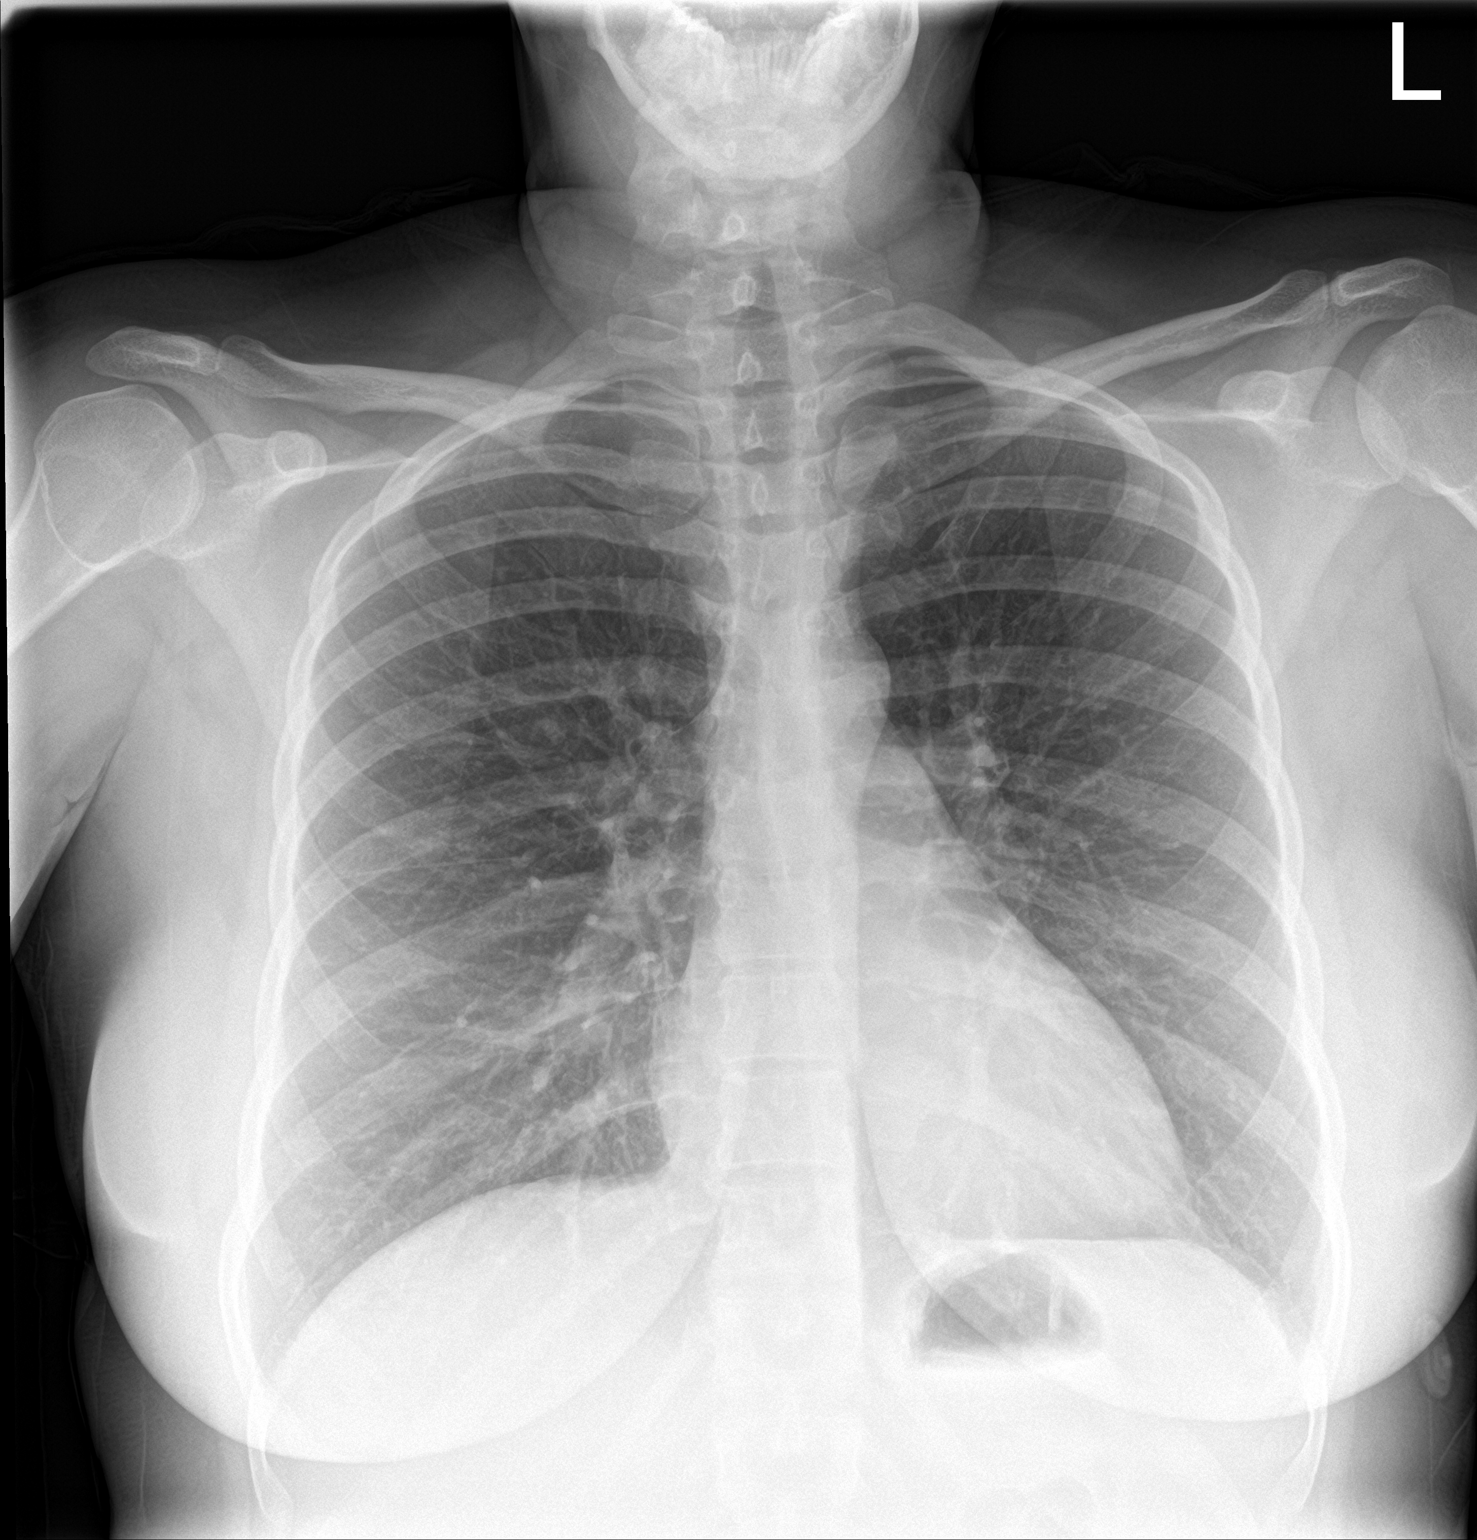

[chest lat]
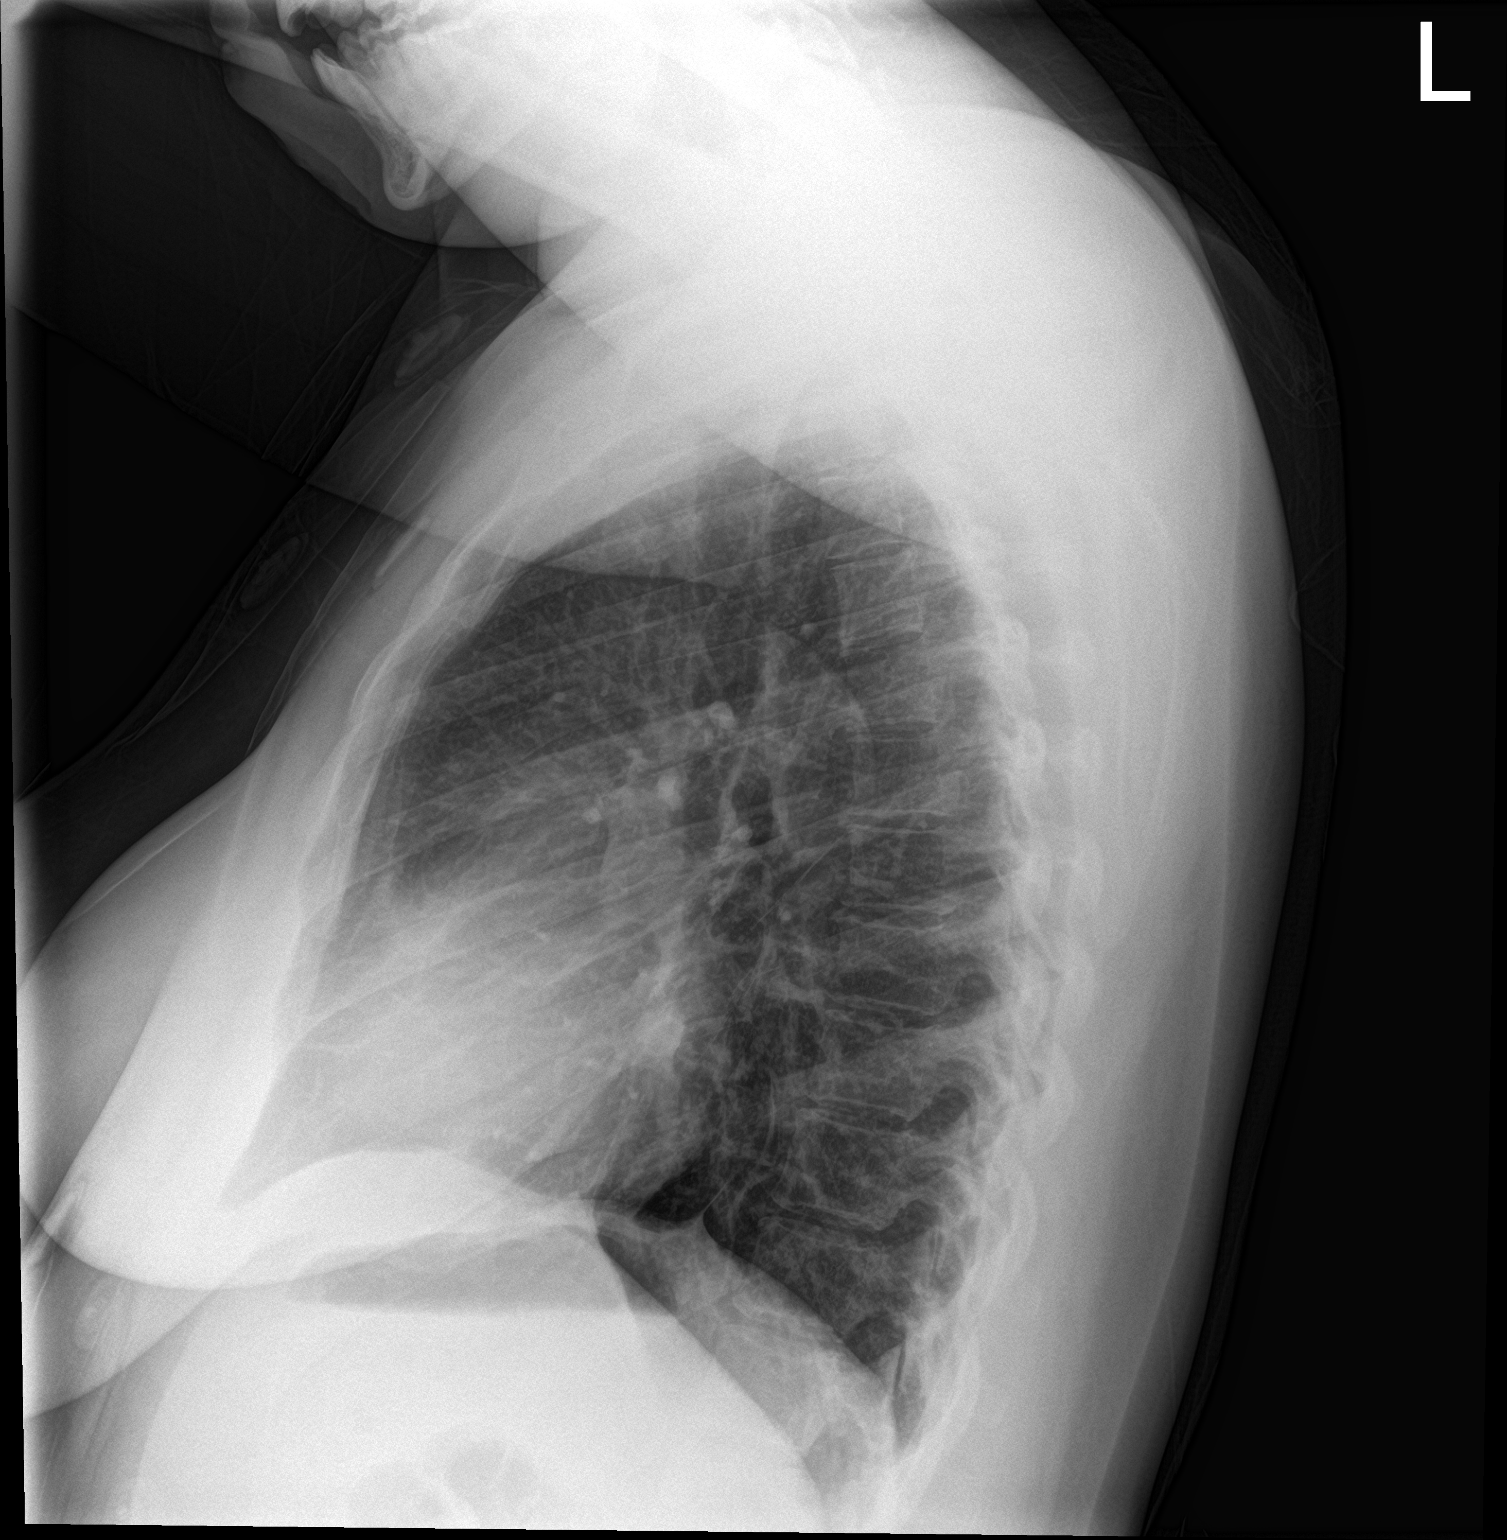

[2 of 2 positions shown; findings below may reference images not displayed]

FINDINGS: The heart size and mediastinal contours are within normal limits.
Both lungs are clear. The visualized skeletal structures are
unremarkable.
IMPRESSION: No active cardiopulmonary disease.

## 2022-02-17 ENCOUNTER — Other Ambulatory Visit: Payer: Self-pay

## 2022-02-17 ENCOUNTER — Emergency Department (HOSPITAL_COMMUNITY)
Admission: EM | Admit: 2022-02-17 | Discharge: 2022-02-18 | Disposition: A | Discharge status: Home / Self Care | Payer: No Typology Code available for payment source | Attending: Emergency Medicine | Admitting: Emergency Medicine

## 2022-02-17 ENCOUNTER — Encounter (HOSPITAL_COMMUNITY): Payer: Self-pay | Admitting: Emergency Medicine

## 2022-02-17 DIAGNOSIS — J45909 Unspecified asthma, uncomplicated: Secondary | ICD-10-CM | POA: Diagnosis not present

## 2022-02-17 DIAGNOSIS — M545 Low back pain, unspecified: Secondary | ICD-10-CM | POA: Diagnosis not present

## 2022-02-17 DIAGNOSIS — M549 Dorsalgia, unspecified: Secondary | ICD-10-CM | POA: Diagnosis present

## 2022-02-17 LAB — COMPREHENSIVE METABOLIC PANEL
ALT: 11 U/L (ref 0–44)
AST: 13 U/L — ABNORMAL LOW (ref 15–41)
Albumin: 4.3 g/dL (ref 3.5–5.0)
Alkaline Phosphatase: 32 U/L — ABNORMAL LOW (ref 38–126)
Anion gap: 8 (ref 5–15)
BUN: 11 mg/dL (ref 6–20)
CO2: 22 mmol/L (ref 22–32)
Calcium: 9.2 mg/dL (ref 8.9–10.3)
Chloride: 108 mmol/L (ref 98–111)
Creatinine, Ser: 0.74 mg/dL (ref 0.44–1.00)
GFR, Estimated: >60 mL/min (ref >60)
Glucose, Bld: 86 mg/dL (ref 70–99)
Potassium: 4.1 mmol/L (ref 3.5–5.1)
Sodium: 138 mmol/L (ref 135–145)
Total Bilirubin: 0.9 mg/dL (ref 0.3–1.2)
Total Protein: 6.8 g/dL (ref 6.5–8.1)

## 2022-02-17 LAB — CBC WITH DIFFERENTIAL/PLATELET
Abs Immature Granulocytes: 0.01 10*3/uL (ref 0.00–0.07)
Basophils Absolute: 0.1 10*3/uL (ref 0.0–0.1)
Basophils Relative: 1 %
Eosinophils Absolute: 0.2 10*3/uL (ref 0.0–0.5)
Eosinophils Relative: 2 %
HCT: 37.3 % (ref 36.0–46.0)
Hemoglobin: 13 g/dL (ref 12.0–15.0)
Immature Granulocytes: 0 %
Lymphocytes Relative: 39 %
Lymphs Abs: 2.8 10*3/uL (ref 0.7–4.0)
MCH: 28.1 pg (ref 26.0–34.0)
MCHC: 34.9 g/dL (ref 30.0–36.0)
MCV: 80.7 fL (ref 80.0–100.0)
Monocytes Absolute: 0.6 10*3/uL (ref 0.1–1.0)
Monocytes Relative: 8 %
Neutro Abs: 3.6 10*3/uL (ref 1.7–7.7)
Neutrophils Relative %: 50 %
Platelets: 319 10*3/uL (ref 150–400)
RBC: 4.62 MIL/uL (ref 3.87–5.11)
RDW: 14.4 % (ref 11.5–15.5)
WBC: 7.2 10*3/uL (ref 4.0–10.5)
nRBC: 0 % (ref 0.0–0.2)

## 2022-02-17 LAB — I-STAT BETA HCG BLOOD, ED (MC, WL, AP ONLY): I-stat hCG, quantitative: <5 m[IU]/mL (ref <5)

## 2022-02-17 NOTE — ED Triage Notes (Signed)
Pt reports bilateral back pain that radiates down front of both thighs; denies injury

## 2022-02-17 NOTE — ED Provider Triage Note (Signed)
Emergency Medicine Provider Triage Evaluation Note  Glady Ouderkirk , a 31 y.o. female  was evaluated in triage.  Pt complains of low back pain with radiation down bilateral legs with lower extremity weakness from the knee down.  Patient reports symptoms beginning this past Wednesday after awakening.  She denies history of similar symptoms.  She states she has been trying to do at home Tylenol, ibuprofen, numbing patches with no relief of symptoms.  She had a telehealth visit who prescribed muscle relaxer which also has not helped her symptoms.  She denies fever, saddle anesthesia, sensory deficits in lower extremities, history of IV drug use, but does report bowel incontinence of 1-2 episodes.  She states she is on [facility] and thought it was related to the medication.  She reports no feeling of having a bowel movement until she realized later that she had defecated.  Denies urinary incontinence/retention.  Review of Systems  Positive: See above Negative:   Physical Exam  BP 116/86 (BP Location: Right Arm)   Pulse 92   Temp 98.2 F (36.8 C) (Oral)   Resp 18   SpO2 100%  Gen:   Awake, no distress   Resp:  Normal effort  MSK:   Moves extremities without difficulty  Other:  Mild midline lumbar spine tenderness.  Paraspinal tenderness bilaterally in the lumbar region.  Medical Decision Making  Medically screening exam initiated at 9:25 PM.  Appropriate orders placed.  Emary Zalar was informed that the remainder of the evaluation will be completed by another provider, this initial triage assessment does not replace that evaluation, and the importance of remaining in the ED until their evaluation is complete.    Wilnette Kales, [place] 02/17/22 2129

## 2022-02-18 LAB — URINALYSIS, ROUTINE W REFLEX MICROSCOPIC
Bilirubin Urine: NEGATIVE
Glucose, UA: NEGATIVE mg/dL
Hgb urine dipstick: NEGATIVE
Ketones, ur: NEGATIVE mg/dL
Nitrite: NEGATIVE
Protein, ur: NEGATIVE mg/dL
Specific Gravity, Urine: 1.028 (ref 1.005–1.030)
pH: 6 (ref 5.0–8.0)

## 2022-02-18 LAB — CK: Total CK: 25 U/L — ABNORMAL LOW (ref 38–234)

## 2022-02-18 MED ORDER — KETOROLAC TROMETHAMINE 15 MG/ML IJ SOLN
30.0000 mg | Freq: Once | INTRAMUSCULAR | Status: AC
  Administered 2022-02-18: 30 mg via INTRAMUSCULAR
  Filled 2022-02-18: qty 2

## 2022-02-18 MED ORDER — OXYCODONE-ACETAMINOPHEN 5-325 MG PO TABS: 1.0000 | ORAL_TABLET | Freq: Four times a day (QID) | ORAL | 0 refills | Status: AC | PRN

## 2022-02-18 MED ORDER — OXYCODONE-ACETAMINOPHEN 5-325 MG PO TABS
1.0000 | ORAL_TABLET | Freq: Once | ORAL | Status: DC
  Filled 2022-02-18: qty 1

## 2022-02-18 NOTE — Discharge Instructions (Addendum)
You were seen today for low back pain.  Your work-up is reassuring.  You will be given a short course of pain medication.  Do not drive while taking pain medications.  Continue ibuprofen every 6 hours.  If you are not improving, you need to follow-up with your primary doctor and may need referral for physical therapy.  If you develop any new or worsening symptoms, you should be reevaluated.

## 2022-02-18 NOTE — ED Provider Notes (Signed)
[facility] DEPARTMENT Provider Note   CSN: [id number] Arrival date & time: 02/17/22  1911     History {Add pertinent medical, surgical, social history, OB history to HPI:1} Chief Complaint  Patient presents with   Back Pain    Mackenzie Maxwell is a 31 y.o. female.  HPI     This is a 31 year old female who presents with back pain.  Patient reports that she has been having bilateral back pain in a band across her back since last Wednesday.  She woke up from sleep with this discomfort.  She denies heavy lifting or injury.  She states that at times the pain radiates into her bilateral thighs.  She states she has taken Tylenol, ibuprofen, and Flexeril with minimal relief.  Denies fevers, drug use, history of cancer.  Denies any bowel or bladder difficulties or saddle anesthesia.  Home Medications Prior to Admission medications   Medication Sig Start Date End Date Taking? Authorizing Provider  Prenatal Vit-Fe Fumarate-FA (PRENATAL MULTIVITAMIN) TABS tablet Take 1 tablet by mouth daily at 12 noon. 10/05/18   [name], CNM  tirzepatide [facility]) 2.5 MG/0.5ML Pen Inject 2.5 mg into the skin once a week.    [provider]      Allergies    Patient has no known allergies.    Review of Systems   Review of Systems  Constitutional:  Negative for fever.  Genitourinary:  Negative for difficulty urinating, dysuria and hematuria.  Musculoskeletal:  Positive for back pain.  Neurological:  Negative for weakness and numbness.    Physical Exam Updated Vital Signs BP 113/62 (BP Location: Right Arm)   Pulse 77   Temp 98.4 F (36.9 C) (Oral)   Resp 14   Ht 1.651 m ('5\' 5"'$ )   Wt 88 kg   LMP 02/02/2022 (Exact Date)   SpO2 100%   BMI 32.28 kg/m  Physical Exam Vitals and nursing note reviewed.  Constitutional:      Appearance: She is well-developed. She is not ill-appearing.  HENT:     Head: Normocephalic and atraumatic.  Eyes:     Pupils: Pupils  are equal, round, and reactive to light.  Cardiovascular:     Rate and Rhythm: Normal rate and regular rhythm.     Heart sounds: Normal heart sounds.  Pulmonary:     Effort: Pulmonary effort is normal. No respiratory distress.     Breath sounds: No wheezing.  Abdominal:     General: Bowel sounds are normal.     Palpations: Abdomen is soft.     Tenderness: There is no right CVA tenderness or left CVA tenderness.  Genitourinary:    Comments: Normal rectal tone and sensation Musculoskeletal:     Cervical back: Neck supple.  Skin:    General: Skin is warm and dry.  Neurological:     Mental Status: She is alert and oriented to person, place, and time.     Comments: 5 out of 5 strength bilateral lower extremities, normal ambulation, symmetric patellar reflexes bilaterally, no clonus  Psychiatric:        Mood and Affect: Mood normal.     ED Results / Procedures / Treatments   Labs (all labs ordered are listed, but only abnormal results are displayed) Labs Reviewed  COMPREHENSIVE METABOLIC PANEL - Abnormal; Notable for the following components:      Result Value   AST 13 (*)    Alkaline Phosphatase 32 (*)    All other components  within normal limits  CBC WITH DIFFERENTIAL/PLATELET  URINALYSIS, ROUTINE W REFLEX MICROSCOPIC  CK  I-STAT BETA HCG BLOOD, ED ([facility], [facility], [facility] ONLY)    EKG None  Radiology No results found.  Procedures Procedures  {Document cardiac monitor, telemetry assessment procedure when appropriate:1}  Medications Ordered in ED Medications  oxyCODONE-acetaminophen (PERCOCET/ROXICET) 5-325 MG per tablet 1 tablet (1 tablet Oral Patient Refused/Not Given 02/18/22 0547)  ketorolac (TORADOL) 15 MG/ML injection 30 mg (30 mg Intramuscular Given 02/18/22 0544)    ED Course/ Medical Decision Making/ A&P                           Medical Decision Making Amount and/or Complexity of Data Reviewed Labs: ordered.  Risk Prescription drug  management.   ***  {Document critical care time when appropriate:1} {Document review of labs and clinical decision tools ie heart score, Chads2Vasc2 etc:1}  {Document your independent review of radiology images, and any outside records:1} {Document your discussion with family members, caretakers, and with consultants:1} {Document social determinants of health affecting pt's care:1} {Document your decision making why or why not admission, treatments were needed:1} Final Clinical Impression(s) / ED Diagnoses Final diagnoses:  None    Rx / DC Orders ED Discharge Orders     None

## 2022-02-20 ENCOUNTER — Other Ambulatory Visit: Payer: Self-pay | Admitting: Pain Medicine

## 2022-02-20 DIAGNOSIS — M545 Low back pain, unspecified: Secondary | ICD-10-CM

## 2022-02-20 LAB — URINE CULTURE

## 2022-04-05 IMAGING — CT CT HEAD W/O CM
3 of 4 series · 16 of 47 positions shown, 19 images · non-contrast
Comparison: 11/18/2014

CLINICAL DATA: Hypertension and headaches, initial encounter

EXAM:
CT HEAD WITHOUT CONTRAST
TECHNIQUE: Contiguous axial images were obtained from the base of the skull
through the vertex without intravenous contrast.

[Series 4: head 2.0 h70h · axial · 0.44mm/px · z∈[+1043,+1187]mm · 10 of 82 slices shown, 13 images]
[im 5/82  brain]
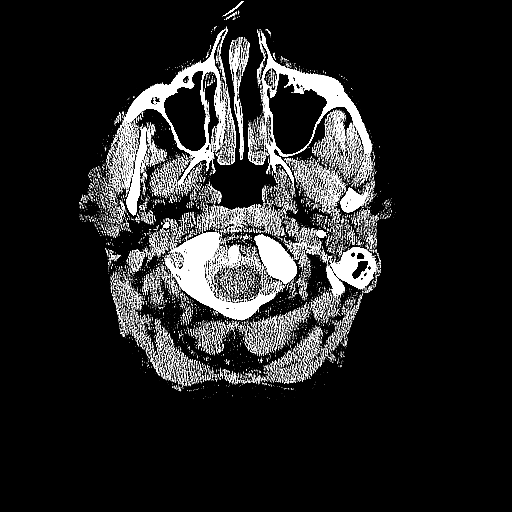
[im 5/82  bone]
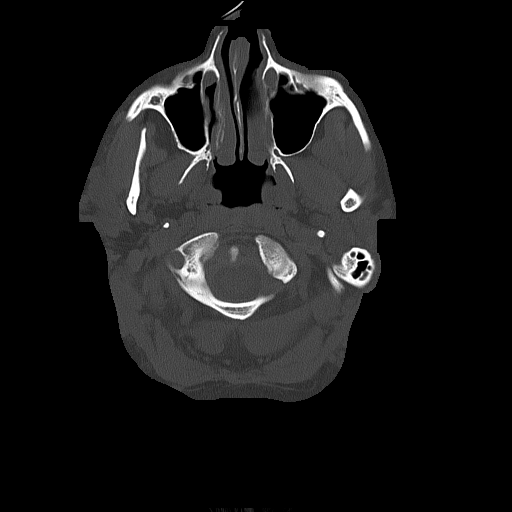
[im 13/82  brain]
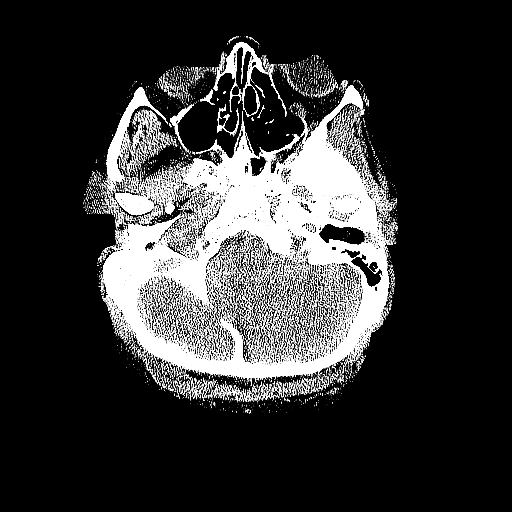
[im 21/82  brain]
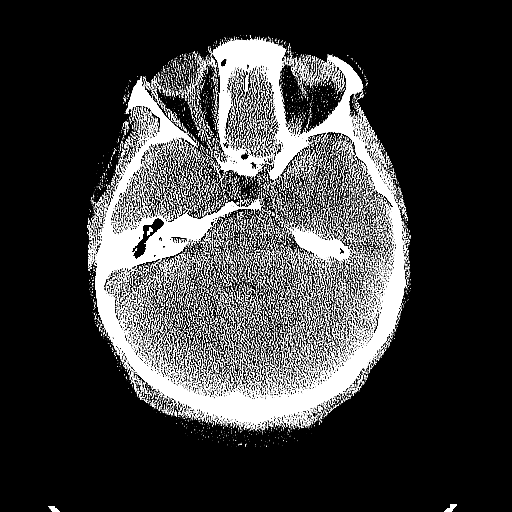
[im 29/82  brain]
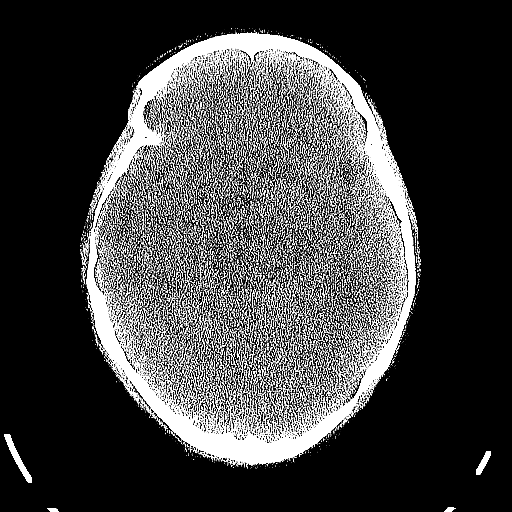
[im 37/82  brain]
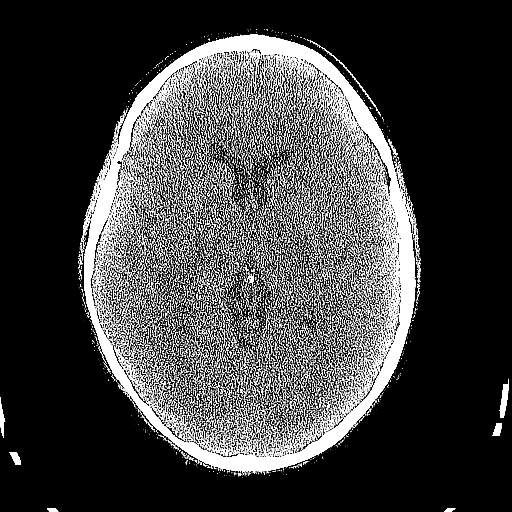
[im 37/82  bone]
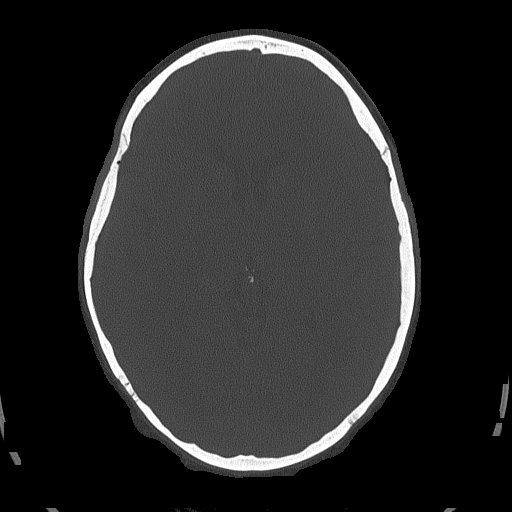
[im 45/82  brain]
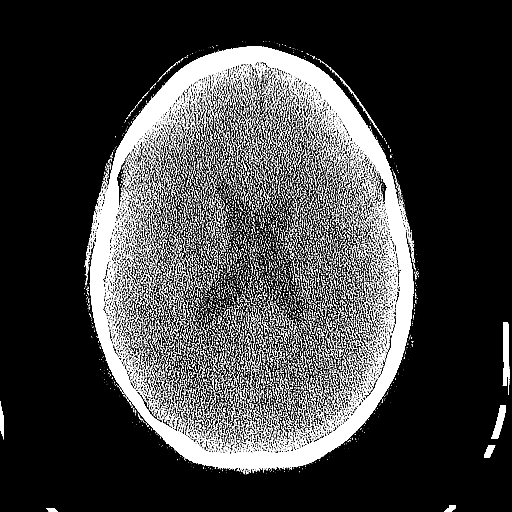
[im 53/82  brain]
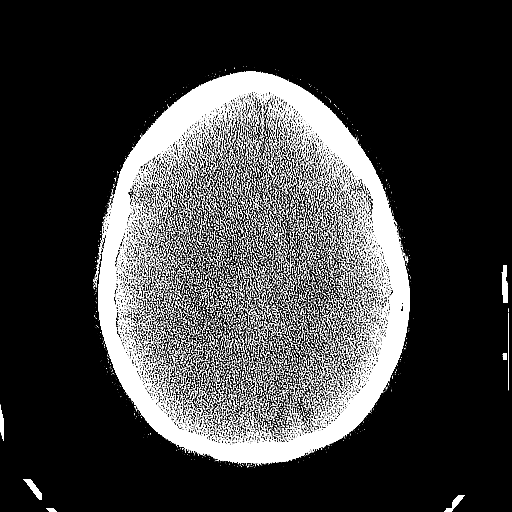
[im 61/82  brain]
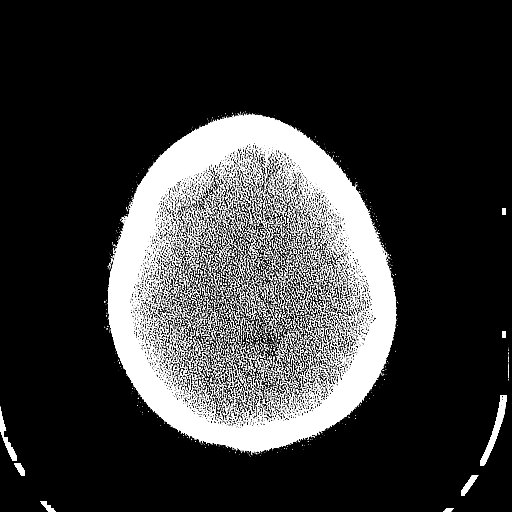
[im 69/82  brain]
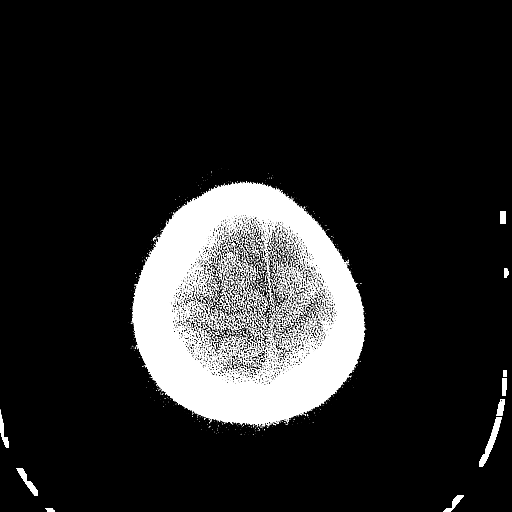
[im 69/82  bone]
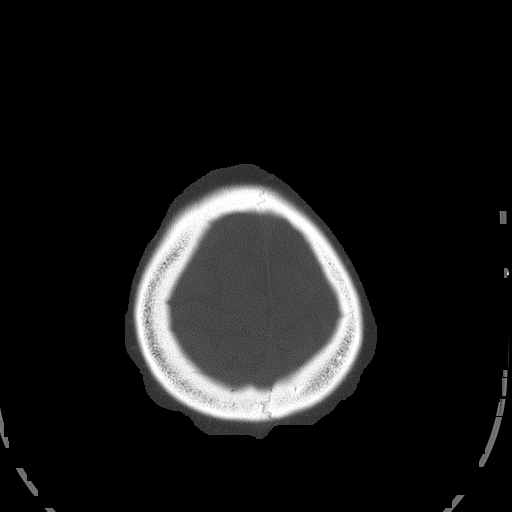
[im 77/82  brain]
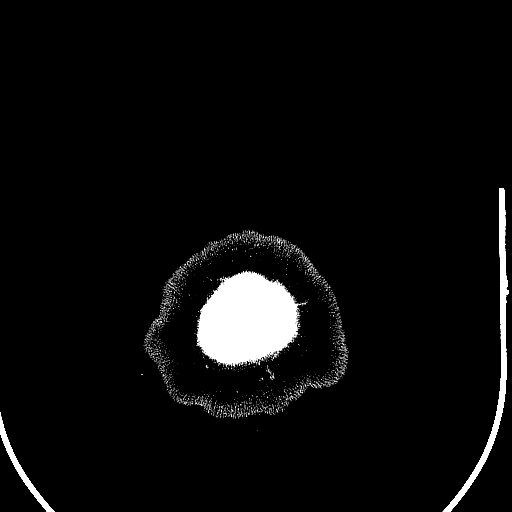

[Series 5: head 3.0 mpr cor · coronal · 0.32mm/px · 3 of 67 slices shown]
[im 23/67  brain]
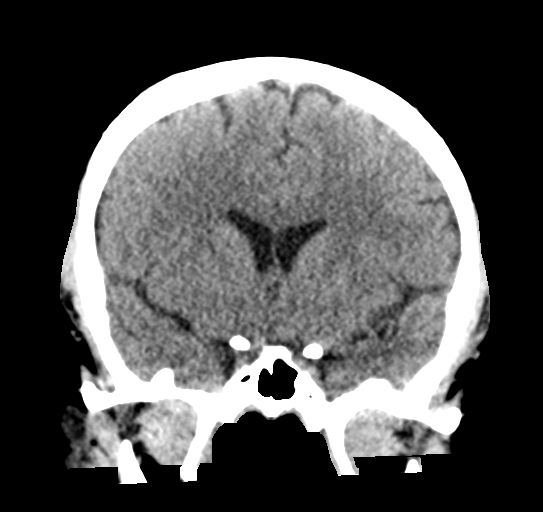
[im 30/67  brain]
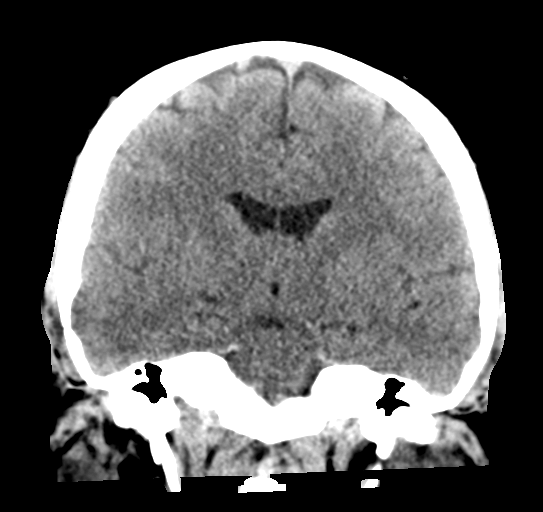
[im 37/67  brain]
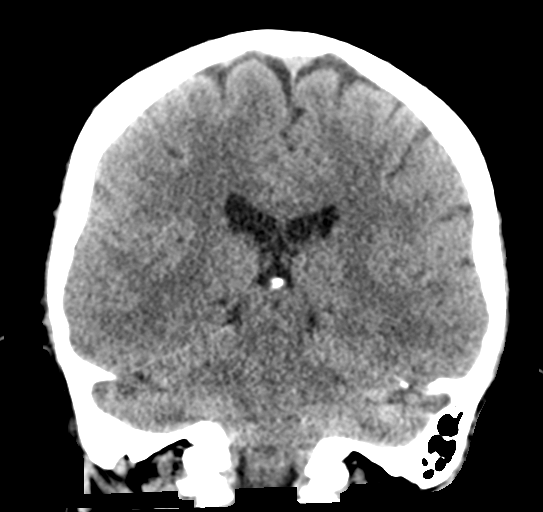

[Series 6: head 3.0 mpr sag · sagittal · 0.32mm/px · 3 of 58 slices shown]
[im 20/58  brain]
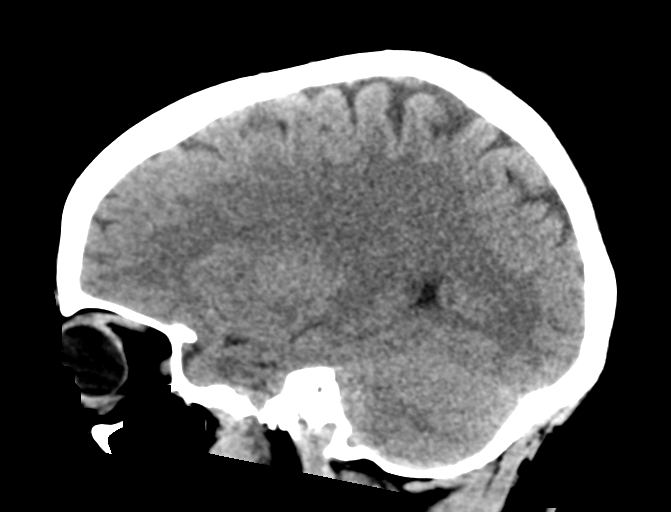
[im 29/58  brain]
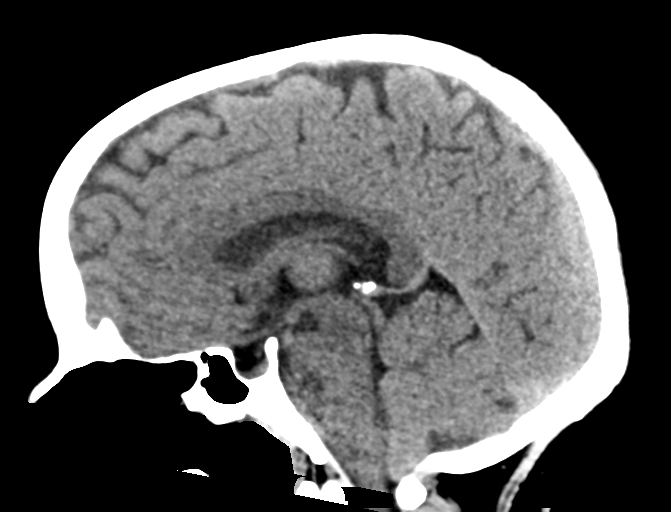
[im 39/58  brain]
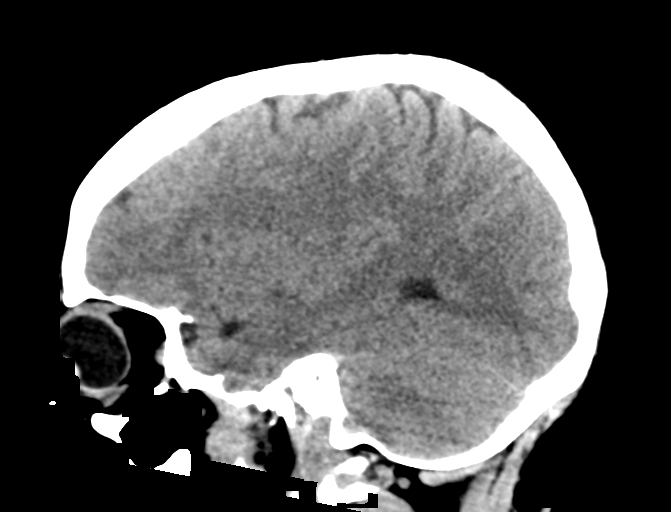

[16 of 47 positions shown; findings below may reference images not displayed]

FINDINGS: Brain: No evidence of acute infarction, hemorrhage, hydrocephalus,
extra-axial collection or mass lesion/mass effect.

Vascular: No hyperdense vessel or unexpected calcification.

Skull: Normal. Negative for fracture or focal lesion.

Sinuses/Orbits: No acute finding.

Other: None.
IMPRESSION: No acute intracranial abnormality noted.

## 2022-10-01 ENCOUNTER — Encounter: Payer: Self-pay | Admitting: Urology

## 2022-10-01 ENCOUNTER — Ambulatory Visit: Payer: No Typology Code available for payment source | Admitting: Urology

## 2022-10-01 VITALS — BP 116/80 | HR 81 | Ht 67.0 in | Wt 196.0 lb

## 2022-10-01 DIAGNOSIS — Z09 Encounter for follow-up examination after completed treatment for conditions other than malignant neoplasm: Secondary | ICD-10-CM | POA: Diagnosis not present

## 2022-10-01 DIAGNOSIS — Z87440 Personal history of urinary (tract) infections: Secondary | ICD-10-CM | POA: Diagnosis not present

## 2022-10-01 DIAGNOSIS — N390 Urinary tract infection, site not specified: Secondary | ICD-10-CM

## 2022-10-01 LAB — MICROSCOPIC EXAMINATION
Cast Type: NONE SEEN
Casts: NONE SEEN /lpf
Crystal Type: NONE SEEN
Crystals: NONE SEEN
Renal Epithel, UA: NONE SEEN /hpf
Trichomonas, UA: NONE SEEN
Yeast, UA: NONE SEEN

## 2022-10-01 LAB — URINALYSIS, ROUTINE W REFLEX MICROSCOPIC
Bilirubin, UA: NEGATIVE
Glucose, UA: NEGATIVE
Ketones, UA: NEGATIVE
Nitrite, UA: NEGATIVE
Protein,UA: NEGATIVE
RBC, UA: NEGATIVE
Specific Gravity, UA: 1.02 (ref 1.005–1.030)
Urobilinogen, Ur: 0.2 mg/dL (ref 0.2–1.0)
pH, UA: 6.5 (ref 5.0–7.5)

## 2022-10-01 NOTE — Progress Notes (Signed)
   Assessment: 1. Chronic UTI (urinary tract infection)      Plan: Today I had a long discussion with the patient regarding UTI prevention strategies including recommendations for a urinary probiotic with lactobacillus. I also told the patient that I suspect she has issues with vaginal contamination and that if she has another symptomatic UTI to present here and we would perform a cath UA for proper collection.  She is agreeable to this.  Chief Complaint: UTI  History of Present Illness:  Mackenzie Maxwell is a 32 y.o. female who is seen in consultation from Mackenzie Maxwell, [place] for evaluation of "chronic UTI". Since November 2022 the patient reports she has had several UTIs.  She has been treated with multiple courses of antibiotics and often times she has been asymptomatic and was not aware that she had a UTI.  Her last symptomatic UTI was in January 2024.  She did grow staph.  In reviewing her urinalyses it appears that she does have significant vaginal contamination and has had multiple organisms present as well on several occasions.  Patient has not had any complicated factors.  She has had no fever associated with any of her episodes.  At baseline, she reports minimal lower urinary tract symptoms.  Past Medical History:  Past Medical History:  Diagnosis Date   Asthma    Depression    Gestational hypertension    Migraines     Past Surgical History:  Past Surgical History:  Procedure Laterality Date   CESAREAN SECTION N/A 10/03/2018   Procedure: CESAREAN SECTION;  Surgeon: Myna Hidalgo, DO;  Location: [facility];  Service: Obstetrics;  Laterality: N/A;   TONSILLECTOMY      Allergies:  No Known Allergies  Family History:  Family History  Problem Relation Age of Onset   Diabetes Maternal Grandmother    Hypertension Maternal Grandmother     Social History:  Social History   Tobacco Use   Smoking status: Former    Packs/day: 1    Types: Cigarettes    Quit date: 09/26/2008     Years since quitting: 14.0   Smokeless tobacco: Never  Vaping Use   Vaping Use: Never used  Substance Use Topics   Alcohol use: Not Currently    Comment: social    Drug use: No    Review of symptoms:  Constitutional:  Negative for unexplained weight loss, night sweats, fever, chills ENT:  Negative for nose bleeds, sinus pain, painful swallowing CV:  Negative for chest pain, shortness of breath, exercise intolerance, palpitations, loss of consciousness Resp:  Negative for cough, wheezing, shortness of breath GI:  Negative for nausea, vomiting, diarrhea, bloody stools GU:  Positives noted in HPI; otherwise negative for gross hematuria, dysuria, urinary incontinence Neuro:  Negative for seizures, poor balance, limb weakness, slurred speech Psych:  Negative for lack of energy, depression, anxiety Endocrine:  Negative for polydipsia, polyuria, symptoms of hypoglycemia (dizziness, hunger, sweating) Hematologic:  Negative for anemia, purpura, petechia, prolonged or excessive bleeding, use of anticoagulants  Allergic:  Negative for difficulty breathing or choking as a result of exposure to anything; no shellfish allergy; no allergic response (rash/itch) to materials, foods  Physical exam: BP 116/80   Pulse 81   Ht 5\' 7"  (1.702 m)   Wt 196 lb (88.9 kg)   BMI 30.70 kg/m  GENERAL APPEARANCE:  Well appearing, well developed, well nourished, NAD   Results: UA negative for infection

## 2023-03-16 ENCOUNTER — Other Ambulatory Visit: Payer: Self-pay | Admitting: Family Medicine

## 2023-03-16 DIAGNOSIS — N644 Mastodynia: Secondary | ICD-10-CM

## 2023-04-10 ENCOUNTER — Ambulatory Visit
Admission: RE | Admit: 2023-04-10 | Discharge: 2023-04-10 | Disposition: A | Discharge status: Home / Self Care | Payer: No Typology Code available for payment source | Source: Ambulatory Visit | Attending: Family Medicine | Admitting: Family Medicine

## 2023-04-10 DIAGNOSIS — N644 Mastodynia: Secondary | ICD-10-CM
